# Patient Record
Sex: Female | Born: 1950 | Race: White | Hispanic: No | Marital: Married | Smoking: Never smoker
Health system: Southern US, Community
[De-identification: ages and names within clinical notes are randomized; demographics above are authoritative.]

## PROBLEM LIST (undated history)

## (undated) DIAGNOSIS — Z87442 Personal history of urinary calculi: Secondary | ICD-10-CM

## (undated) DIAGNOSIS — J45909 Unspecified asthma, uncomplicated: Secondary | ICD-10-CM

## (undated) DIAGNOSIS — Z85828 Personal history of other malignant neoplasm of skin: Secondary | ICD-10-CM

## (undated) DIAGNOSIS — Z9289 Personal history of other medical treatment: Secondary | ICD-10-CM

## (undated) DIAGNOSIS — M1990 Unspecified osteoarthritis, unspecified site: Secondary | ICD-10-CM

## (undated) DIAGNOSIS — C801 Malignant (primary) neoplasm, unspecified: Secondary | ICD-10-CM

## (undated) DIAGNOSIS — M810 Age-related osteoporosis without current pathological fracture: Secondary | ICD-10-CM

## (undated) DIAGNOSIS — Z8489 Family history of other specified conditions: Secondary | ICD-10-CM

## (undated) HISTORY — PX: CARPAL TUNNEL RELEASE: SHX101

## (undated) HISTORY — PX: COLONOSCOPY W/ POLYPECTOMY: SHX1380

## (undated) HISTORY — PX: TONSILLECTOMY: SUR1361

---

## 2007-09-18 ENCOUNTER — Other Ambulatory Visit: Admission: RE | Admit: 2007-09-18 | Discharge: 2007-09-18 | Payer: Self-pay | Admitting: Family Medicine

## 2009-03-18 ENCOUNTER — Other Ambulatory Visit: Admission: RE | Admit: 2009-03-18 | Discharge: 2009-03-18 | Payer: Self-pay | Admitting: Family Medicine

## 2010-08-25 HISTORY — PX: JOINT REPLACEMENT: SHX530

## 2010-09-21 ENCOUNTER — Inpatient Hospital Stay (HOSPITAL_COMMUNITY): Admission: RE | Admit: 2010-09-21 | Discharge: 2010-09-25 | Payer: Self-pay | Admitting: Orthopedic Surgery

## 2011-01-05 LAB — BASIC METABOLIC PANEL
BUN: 11 mg/dL (ref 6–23)
CO2: 27 mEq/L (ref 19–32)
Chloride: 105 mEq/L (ref 96–112)
Chloride: 106 mEq/L (ref 96–112)
Creatinine, Ser: 0.83 mg/dL (ref 0.4–1.2)
GFR calc Af Amer: >60 mL/min (ref >60)
GFR calc Af Amer: >60 mL/min (ref >60)
GFR calc non Af Amer: 55 mL/min — ABNORMAL LOW (ref >60)
GFR calc non Af Amer: 59 mL/min — ABNORMAL LOW (ref >60)
Potassium: 4.1 mEq/L (ref 3.5–5.1)
Potassium: 4.5 mEq/L (ref 3.5–5.1)
Sodium: 136 mEq/L (ref 135–145)

## 2011-01-05 LAB — TYPE AND SCREEN

## 2011-01-05 LAB — CBC
HCT: 28 % — ABNORMAL LOW (ref 36.0–46.0)
HCT: 28.7 % — ABNORMAL LOW (ref 36.0–46.0)
Hemoglobin: 10 g/dL — ABNORMAL LOW (ref 12.0–15.0)
Hemoglobin: 14.3 g/dL (ref 12.0–15.0)
MCH: 30.1 pg (ref 26.0–34.0)
MCH: 30.7 pg (ref 26.0–34.0)
MCH: 31.9 pg (ref 26.0–34.0)
MCV: 87.9 fL (ref 78.0–100.0)
MCV: 90.4 fL (ref 78.0–100.0)
MCV: 89.7 fL (ref 78.0–100.0)
MCV: 92.7 fL (ref 78.0–100.0)
Platelets: 131 10*3/uL — ABNORMAL LOW (ref 150–400)
Platelets: 153 10*3/uL (ref 150–400)
Platelets: 123 10*3/uL — ABNORMAL LOW (ref 150–400)
RBC: 3.1 MIL/uL — ABNORMAL LOW (ref 3.87–5.11)
RBC: 3.12 MIL/uL — ABNORMAL LOW (ref 3.87–5.11)
RBC: 4.47 MIL/uL (ref 3.87–5.11)
RDW: 12.9 % (ref 11.5–15.5)
RDW: 14.8 % (ref 11.5–15.5)
WBC: 6.1 10*3/uL (ref 4.0–10.5)
WBC: 6.1 10*3/uL (ref 4.0–10.5)
WBC: 6.4 10*3/uL (ref 4.0–10.5)

## 2011-01-05 LAB — URINALYSIS, ROUTINE W REFLEX MICROSCOPIC
Bilirubin Urine: NEGATIVE
Glucose, UA: NEGATIVE mg/dL
Hgb urine dipstick: NEGATIVE
Specific Gravity, Urine: 1.031 — ABNORMAL HIGH (ref 1.005–1.030)
pH: 6.5 (ref 5.0–8.0)

## 2011-01-05 LAB — PREPARE RBC (CROSSMATCH)

## 2011-01-05 LAB — PROTIME-INR
INR: 1.11 (ref 0.00–1.49)
INR: 1.56 — ABNORMAL HIGH (ref 0.00–1.49)
Prothrombin Time: 23.7 seconds — ABNORMAL HIGH (ref 11.6–15.2)

## 2011-01-05 LAB — COMPREHENSIVE METABOLIC PANEL
AST: 23 U/L (ref 0–37)
BUN: 17 mg/dL (ref 6–23)
CO2: 28 mEq/L (ref 19–32)
Chloride: 102 mEq/L (ref 96–112)
Creatinine, Ser: 1.1 mg/dL (ref 0.4–1.2)
GFR calc Af Amer: >60 mL/min (ref >60)
GFR calc non Af Amer: 51 mL/min — ABNORMAL LOW (ref >60)
Total Bilirubin: 0.8 mg/dL (ref 0.3–1.2)

## 2011-01-05 LAB — APTT: aPTT: 31 seconds (ref 24–37)

## 2011-01-05 LAB — ABO/RH: ABO/RH(D): O POS

## 2011-10-26 HISTORY — PX: FRACTURE SURGERY: SHX138

## 2014-02-04 ENCOUNTER — Other Ambulatory Visit (HOSPITAL_COMMUNITY)
Admission: RE | Admit: 2014-02-04 | Discharge: 2014-02-04 | Disposition: A | Discharge status: Home / Self Care | Payer: BC Managed Care – PPO | Source: Ambulatory Visit | Attending: Family Medicine | Admitting: Family Medicine

## 2014-02-04 ENCOUNTER — Other Ambulatory Visit: Payer: Self-pay | Admitting: Family Medicine

## 2014-02-04 DIAGNOSIS — Z1151 Encounter for screening for human papillomavirus (HPV): Secondary | ICD-10-CM | POA: Insufficient documentation

## 2014-02-04 DIAGNOSIS — Z01419 Encounter for gynecological examination (general) (routine) without abnormal findings: Secondary | ICD-10-CM | POA: Insufficient documentation

## 2015-06-05 ENCOUNTER — Ambulatory Visit: Payer: Self-pay | Admitting: Orthopedic Surgery

## 2015-06-05 NOTE — H&P (Signed)
Joan Chan DOB: [birth year] Married / Language: English / Race: White Female Date of Admission:  06/25/2015 CC:  Left Hip Pain History of Present Illness The patient is a 64 year old female who comes in for a preoperative History and Physical. The patient is scheduled for a left total hip arthroplasty (anterior) to be performed by Dr. Dione Plover. Aluisio, MD at [facility] on 06/25/2015. The patient is a 64 year old female who presented with a hip problem. The patient reports left hip problems including pain, weakness and stiffness symptoms that have been present for 2 year(s). The symptoms began without any known injury. The patient reports symptoms radiating to the: left groin and left thigh anteriorly.The patient feels as if their symptoms are does feel they are worsening. Current treatment includes nonsteroidal anti-inflammatory drugs (Cumarin). Pertinent medical history includes total hip replacement (on the right in 2011. She reports the right hip is doing well). Prior to being seen today the patient was previously evaluated by a colleague. Previous treatment for this problem has included corticosteroid injection (She reports her pain management physician ordered an injection. The injection really only for one day). She is doing "awesome" with regards to the right total hip replacemnent and is now ready to proceed with the left hip at this time. She has known arthritis with the left hip and has been putting it off for about a year due to timing with her riding. She has now reached a point where she would like to proceed with surgery and comes in to discuss the timing of the surgery and asking about the anterior approach. The hip has been progressive over the past year with increasing stiffness and decreasing range of motion and now night pain. She states that is had become more difficult to get shoes and socks on, getting in and out of the car, and going up and down steps. She continues to  stay active with her riding and also gets on the eliptical three times a week (which helps with her stiffness) for exercise. She still tries to play teenis but this hurts a good bit after coming off the court. She used Aleve and Advil in the past but nowmainly uses Cumarin which does make a difference. On the really bad days, she will even supplement this with an occasional Advil at time. She comes in the office to discuss total hip repalcement for the left hip. She said the right hip which we replaced several years ago is doing great. She is real pleased with that. Left hip starting to get progressively worse. She is having the above mentioned functional issues and having a lot of pain. She is at a stage now where she wants to get the left hip fixed. They have been treated conservatively in the past for the above stated problem and despite conservative measures, they continue to have progressive pain and severe functional limitations and dysfunction. They have failed non-operative management including home exercise, medications. It is felt that they would benefit from undergoing total joint replacement. Risks and benefits of the procedure have been discussed with the patient and they elect to proceed with surgery. There are no active contraindications to surgery such as ongoing infection or rapidly progressive neurological disease.  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  Intolerance Coumadin *ANTICOAGULANTS* Supertherapuetic with last total hip procedure - 2011  Family History Osteoarthritis mother Depression mother Cerebrovascular Accident father Heart disease in female family member before  age 26 Heart Disease father Cancer father Hypertension father  Social History Exercise Exercises daily; does running / walking, other and gym / weights Current work status working full time Drug/Alcohol Rehab (Currently) no Tobacco use never smoker Marital status married Alcohol use current drinker; drinks wine; 5-7 per week Illicit drug use no Number of flights of stairs before winded 2-3 Drug/Alcohol Rehab (Previously) no Living situation live with spouse Pain Contract no Children 0 Post-Surgical Plans Home Advance Directives Living Will, Healthcare POA  Medication History Calcium (Oral) Specific dose unknown - Active. Vitamin D3 (Oral) Specific dose unknown - Active. Fish Oil Active. Magnesium Gluconate (Oral) Specific dose unknown - Active. cumarin Active. Mulitvitamin Active. MSM Active. Vitamin C Active.  Past Surgical History Carpal Tunnel Repair right Total Hip Replacement Date: 08/2010. right Tonsillectomy Colon Polyp Removal - Colonoscopy Left Humerus Surgery for Open Fracture Date: 07/2012.  Review of Systems General Not Present- Chills, Fatigue, Fever, Memory Loss, Night Sweats, Weight Gain and Weight Loss. Skin Not Present- Eczema, Hives, Itching, Lesions and Rash. HEENT Not Present- Dentures, Double Vision, Headache, Hearing Loss, Tinnitus and Visual Loss. Respiratory Not Present- Allergies, Chronic Cough, Coughing up blood, Shortness of breath at rest and Shortness of breath with exertion. Cardiovascular Not Present- Chest Pain, Difficulty Breathing Lying Down, Murmur, Palpitations, Racing/skipping heartbeats and Swelling. Gastrointestinal Not Present- Abdominal Pain, Bloody Stool, Constipation, Diarrhea, Difficulty Swallowing, Heartburn, Jaundice, Loss of appetitie, Nausea and Vomiting. Female Genitourinary Not Present- Blood in Urine, Discharge, Flank Pain, Incontinence, Painful Urination, Urgency, Urinary frequency,  Urinary Retention, Urinating at Night and Weak urinary stream. Musculoskeletal Present- Joint Pain (left hip). Not Present- Back Pain, Joint Swelling, Morning Stiffness, Muscle Pain, Muscle Weakness and Spasms. Neurological Not Present- Blackout spells, Difficulty with balance, Dizziness, Paralysis, Tremor and Weakness. Psychiatric Not Present- Insomnia.  Vitals Weight: 163 lb Height: 71in Weight was reported by patient. Height was reported by patient. Body Surface Area: 1.93 m Body Mass Index: 22.73 kg/m  BP: 128/70 (Sitting, Right Arm, Standard)  Physical Exam General Mental Status -Alert, cooperative and good historian. General Appearance-pleasant, Not in acute distress. Orientation-Oriented X3. Build & Nutrition-Well nourished and Well developed.  Head and Neck Head-normocephalic, atraumatic . Neck Global Assessment - supple, no bruit auscultated on the right, no bruit auscultated on the left.  Eye Pupil - Bilateral-Regular and Round. Motion - Bilateral-EOMI.  Chest and Lung Exam Auscultation Breath sounds - clear at anterior chest wall and clear at posterior chest wall. Adventitious sounds - No Adventitious sounds.  Cardiovascular Auscultation Rhythm - Regular rate and rhythm. Heart Sounds - S1 WNL and S2 WNL. Murmurs & Other Heart Sounds - Auscultation of the heart reveals - No Murmurs.  Abdomen Palpation/Percussion Tenderness - Abdomen is non-tender to palpation. Rigidity (guarding) - Abdomen is soft. Auscultation Auscultation of the abdomen reveals - Bowel sounds normal.  Female Genitourinary Note: Not done, not pertinent to present illness  Musculoskeletal Note: On exam, very pleasant well-developed female, alert and oriented, no apparent distress. Right hip flexes 130, rotate in 30 and out 40, abduct 40 without discomfort. Left hip flexes about 100, rotate in 10 and out 20, abduct 20 with discomfort. Knee exams are normal. Pulse  sensation and motor intact. Antalgic gait pattern on the left.  RADIOGRAPHS AP pelvis and AP and lateral of both hips show  that the prosthesis on the right remains in excellent position, no abnormalities. On the left, she has got dysplasia with about 50% uncovering of her femoral head with bone-on-bone changes in the hip and subchondral cystic changes.  Assessment & Plan  Primary osteoarthritis of left hip (M16.12) Note:Surgical Plans: Left Total Hip Replacement - Anterior Approach  Disposition: Home  PCP: Dr. Cipriano Mile - Patient has been seen preoperatively and felt to be stable for surgery.  IV TXA  Anesthesia Issues: None  Signed electronically by Ok Edwards, III PA-C

## 2015-06-17 ENCOUNTER — Ambulatory Visit: Payer: Self-pay | Admitting: Orthopedic Surgery

## 2015-06-17 NOTE — Progress Notes (Signed)
Preoperative surgical orders have been place into the Epic hospital system for Joan Chan on 06/17/2015, 12:18 PM  by Mickel Crow for surgery on 06/25/2015.  Preop Total Hip orders including Experel Injecion, IV Tylenol, and IV Decadron as long as there are no contraindications to the above medications. Joan Muslim, PA-C

## 2015-06-19 NOTE — Patient Instructions (Addendum)
YOUR PROCEDURE IS SCHEDULED ON : 8/31/216  REPORT TO [facility] MAIN ENTRANCE FOLLOW SIGNS TO EAST ELEVATOR - GO TO 3rd FLOOR CHECK IN AT 3 EAST NURSES STATION (SHORT STAY) AT: 1:00 PM  CALL THIS NUMBER IF YOU HAVE PROBLEMS THE MORNING OF SURGERY [phone]  REMEMBER:ONLY 1 PER PERSON MAY GO TO SHORT STAY WITH YOU TO GET READY THE MORNING OF YOUR SURGERY  DO NOT EAT FOOD  AFTER MIDNIGHT  MAY HAVE CLEAR LIQUIDS UNTIL 9:00 AM  TAKE THESE MEDICINES THE MORNING OF SURGERY: NONE     CLEAR LIQUID DIET   Foods Allowed                                                                     Foods Excluded  Coffee and tea, regular and decaf                             liquids that you cannot  Plain Jell-O in any flavor                                             see through such as: Fruit ices (not with fruit pulp)                                     milk, soups, orange juice  Iced Popsicles                                                 All solid food Carbonated beverages, regular and diet                                    Cranberry, grape and apple juices Sports drinks like Gatorade Lightly seasoned clear broth or consume(fat free) Sugar, honey syrup  _____________________________________________________________________    YOU MAY NOT HAVE ANY METAL ON YOUR BODY INCLUDING HAIR PINS AND PIERCING'S. DO NOT WEAR JEWELRY, MAKEUP, LOTIONS, POWDERS OR PERFUMES. DO NOT WEAR NAIL POLISH. DO NOT SHAVE 48 HRS PRIOR TO SURGERY. MEN MAY SHAVE FACE AND NECK.  DO NOT [place]. [facility] IS NOT RESPONSIBLE FOR VALUABLES.  CONTACTS, DENTURES OR PARTIALS MAY NOT BE WORN TO SURGERY. LEAVE SUITCASE IN CAR. CAN BE BROUGHT TO ROOM AFTER SURGERY.  PATIENTS DISCHARGED THE DAY OF SURGERY WILL NOT BE ALLOWED TO DRIVE HOME.  PLEASE READ OVER THE FOLLOWING INSTRUCTION SHEETS _________________________________________________________________________________                                         - PREPARING FOR SURGERY  Before surgery, you can play an important role.  Because skin is not sterile, your skin needs  to be as free of germs as possible.  You can reduce the number of germs on your skin by washing with CHG (chlorahexidine gluconate) soap before surgery.  CHG is an antiseptic cleaner which kills germs and bonds with the skin to continue killing germs even after washing. Please DO NOT use if you have an allergy to CHG or antibacterial soaps.  If your skin becomes reddened/irritated stop using the CHG and inform your nurse when you arrive at Short Stay. Do not shave (including legs and underarms) for at least 48 hours prior to the first CHG shower.  You may shave your face. Please follow these instructions carefully:   1.  Shower with CHG Soap the night before surgery and the  morning of Surgery.   2.  If you choose to wash your hair, wash your hair first as usual with your  normal  Shampoo.   3.  After you shampoo, rinse your hair and body thoroughly to remove the  shampoo.                                         4.  Use CHG as you would any other liquid soap.  You can apply chg directly  to the skin and wash . Gently wash with scrungie or clean wascloth    5.  Apply the CHG Soap to your body ONLY FROM THE NECK DOWN.   Do not use on open                           Wound or open sores. Avoid contact with eyes, ears mouth and genitals (private parts).                        Genitals (private parts) with your normal soap.              6.  Wash thoroughly, paying special attention to the area where your surgery  will be performed.   7.  Thoroughly rinse your body with warm water from the neck down.   8.  DO NOT shower/wash with your normal soap after using and rinsing off  the CHG Soap .                9.  Pat yourself dry with a clean towel.             10.  Wear clean night clothes to bed after shower             11.  Place  clean sheets on your bed the night of your first shower and do not  sleep with pets.  Day of Surgery : Do not apply any lotions/deodorants the morning of surgery.  Please wear clean clothes to the hospital/surgery center.  FAILURE TO FOLLOW THESE INSTRUCTIONS MAY RESULT IN THE CANCELLATION OF YOUR SURGERY    PATIENT SIGNATURE_________________________________  ______________________________________________________________________     Joan Chan  An incentive spirometer is a tool that can help keep your lungs clear and active. This tool measures how well you are filling your lungs with each breath. Taking long deep breaths may help reverse or decrease the chance of developing breathing (pulmonary) problems (especially infection) following:  A long period of time when you are unable to move or be active. BEFORE THE PROCEDURE  If the spirometer includes an indicator to show your best effort, your nurse or respiratory therapist will set it to a desired goal.  If possible, sit up straight or lean slightly forward. Try not to slouch.  Hold the incentive spirometer in an upright position. INSTRUCTIONS FOR USE   Sit on the edge of your bed if possible, or sit up as far as you can in bed or on a chair.  Hold the incentive spirometer in an upright position.  Breathe out normally.  Place the mouthpiece in your mouth and seal your lips tightly around it.  Breathe in slowly and as deeply as possible, raising the piston or the ball toward the top of the column.  Hold your breath for 3-5 seconds or for as long as possible. Allow the piston or ball to fall to the bottom of the column.  Remove the mouthpiece from your mouth and breathe out normally.  Rest for a few seconds and repeat Steps 1 through 7 at least 10 times every 1-2 hours when you are awake. Take your time and take a few normal breaths between deep breaths.  The spirometer may include an indicator to show your best  effort. Use the indicator as a goal to work toward during each repetition.  After each set of 10 deep breaths, practice coughing to be sure your lungs are clear. If you have an incision (the cut made at the time of surgery), support your incision when coughing by placing a pillow or rolled up towels firmly against it. Once you are able to get out of bed, walk around indoors and cough well. You may stop using the incentive spirometer when instructed by your caregiver.  RISKS AND COMPLICATIONS  Take your time so you do not get dizzy or light-headed.  If you are in pain, you may need to take or ask for pain medication before doing incentive spirometry. It is harder to take a deep breath if you are having pain. AFTER USE  Rest and breathe slowly and easily.  It can be helpful to keep track of a log of your progress. Your caregiver can provide you with a simple table to help with this. If you are using the spirometer at home, follow these instructions: [place] IF:   You are having difficultly using the spirometer.  You have trouble using the spirometer as often as instructed.  Your pain medication is not giving enough relief while using the spirometer.  You develop fever of 100.5 F (38.1 C) or higher. SEEK IMMEDIATE MEDICAL CARE IF:   You cough up bloody sputum that had not been present before.  You develop fever of 102 F (38.9 C) or greater.  You develop worsening pain at or near the incision site. MAKE SURE YOU:   Understand these instructions.  Will watch your condition.  Will get help right away if you are not doing well or get worse. Document Released: 02/21/2007 Document Revised: 01/03/2012 Document Reviewed: 04/24/2007 Exit[employer] Patient Information 2014 [employer], [place].   ________________________________________________________________________  WHAT IS A BLOOD TRANSFUSION? Blood Transfusion Information  A transfusion is the replacement of blood or some of  its parts. Blood is made up of multiple cells which provide different functions.  Red blood cells carry oxygen and are used for blood loss replacement.  White blood cells fight against infection.  Platelets control bleeding.  Plasma helps clot blood.  Other blood products are available for specialized needs, such as hemophilia or other clotting  disorders. BEFORE THE TRANSFUSION  Who gives blood for transfusions?   Healthy volunteers who are fully evaluated to make sure their blood is safe. This is blood bank blood. Transfusion therapy is the safest it has ever been in the practice of medicine. Before blood is taken from a donor, a complete history is taken to make sure that person has no history of diseases nor engages in risky social behavior (examples are intravenous drug use or sexual activity with multiple partners). The donor's travel history is screened to minimize risk of transmitting infections, such as malaria. The donated blood is tested for signs of infectious diseases, such as HIV and hepatitis. The blood is then tested to be sure it is compatible with you in order to minimize the chance of a transfusion reaction. If you or a relative donates blood, this is often done in anticipation of surgery and is not appropriate for emergency situations. It takes many days to process the donated blood. RISKS AND COMPLICATIONS Although transfusion therapy is very safe and saves many lives, the main dangers of transfusion include:   Getting an infectious disease.  Developing a transfusion reaction. This is an allergic reaction to something in the blood you were given. Every precaution is taken to prevent this. The decision to have a blood transfusion has been considered carefully by your caregiver before blood is given. Blood is not given unless the benefits outweigh the risks. AFTER THE TRANSFUSION  Right after receiving a blood transfusion, you will usually feel much better and more  energetic. This is especially true if your red blood cells have gotten low (anemic). The transfusion raises the level of the red blood cells which carry oxygen, and this usually causes an energy increase.  The nurse administering the transfusion will monitor you carefully for complications. HOME CARE INSTRUCTIONS  No special instructions are needed after a transfusion. You may find your energy is better. Speak with your caregiver about any limitations on activity for underlying diseases you may have. SEEK MEDICAL CARE IF:   Your condition is not improving after your transfusion.  You develop redness or irritation at the intravenous (IV) site. SEEK IMMEDIATE MEDICAL CARE IF:  Any of the following symptoms occur over the next 12 hours:  Shaking chills.  You have a temperature by mouth above 102 F (38.9 C), not controlled by medicine.  Chest, back, or muscle pain.  People around you feel you are not acting correctly or are confused.  Shortness of breath or difficulty breathing.  Dizziness and fainting.  You get a rash or develop hives.  You have a decrease in urine output.  Your urine turns a dark color or changes to pink, red, or brown. Any of the following symptoms occur over the next 10 days:  You have a temperature by mouth above 102 F (38.9 C), not controlled by medicine.  Shortness of breath.  Weakness after normal activity.  The white part of the eye turns yellow (jaundice).  You have a decrease in the amount of urine or are urinating less often.  Your urine turns a dark color or changes to pink, red, or brown. Document Released: 10/08/2000 Document Revised: 01/03/2012 Document Reviewed: 05/27/2008 [facility] Patient Information 2014 [place], [place].  _______________________________________________________________________

## 2015-06-20 ENCOUNTER — Encounter (HOSPITAL_COMMUNITY): Payer: Self-pay

## 2015-06-20 ENCOUNTER — Encounter (HOSPITAL_COMMUNITY)
Admission: RE | Admit: 2015-06-20 | Discharge: 2015-06-20 | Disposition: A | Discharge status: Home / Self Care | Payer: Medicare Other | Source: Ambulatory Visit | Attending: Orthopedic Surgery | Admitting: Orthopedic Surgery

## 2015-06-20 DIAGNOSIS — M169 Osteoarthritis of hip, unspecified: Secondary | ICD-10-CM | POA: Insufficient documentation

## 2015-06-20 DIAGNOSIS — Z85828 Personal history of other malignant neoplasm of skin: Secondary | ICD-10-CM | POA: Insufficient documentation

## 2015-06-20 DIAGNOSIS — Z01812 Encounter for preprocedural laboratory examination: Secondary | ICD-10-CM | POA: Insufficient documentation

## 2015-06-20 DIAGNOSIS — Z87442 Personal history of urinary calculi: Secondary | ICD-10-CM | POA: Insufficient documentation

## 2015-06-20 HISTORY — DX: Personal history of other malignant neoplasm of skin: Z85.828

## 2015-06-20 HISTORY — DX: Unspecified osteoarthritis, unspecified site: M19.90

## 2015-06-20 HISTORY — DX: Personal history of urinary calculi: Z87.442

## 2015-06-20 HISTORY — DX: Unspecified asthma, uncomplicated: J45.909

## 2015-06-20 HISTORY — DX: Personal history of other medical treatment: Z92.89

## 2015-06-20 HISTORY — DX: Malignant (primary) neoplasm, unspecified: C80.1

## 2015-06-20 HISTORY — DX: Age-related osteoporosis without current pathological fracture: M81.0

## 2015-06-20 HISTORY — DX: Family history of other specified conditions: Z84.89

## 2015-06-20 LAB — CBC
HEMATOCRIT: 39.8 % (ref 36.0–46.0)
HEMOGLOBIN: 13.3 g/dL (ref 12.0–15.0)
MCH: 30.4 pg (ref 26.0–34.0)
MCHC: 33.4 g/dL (ref 30.0–36.0)
MCV: 91.1 fL (ref 78.0–100.0)
Platelets: 202 10*3/uL (ref 150–400)
RBC: 4.37 MIL/uL (ref 3.87–5.11)
RDW: 13.2 % (ref 11.5–15.5)
WBC: 5.2 10*3/uL (ref 4.0–10.5)

## 2015-06-20 LAB — URINALYSIS, ROUTINE W REFLEX MICROSCOPIC
Bilirubin Urine: NEGATIVE
Glucose, UA: NEGATIVE mg/dL
Hgb urine dipstick: NEGATIVE
Ketones, ur: NEGATIVE mg/dL
LEUKOCYTES UA: NEGATIVE
NITRITE: NEGATIVE
PH: 5.5 (ref 5.0–8.0)
Protein, ur: NEGATIVE mg/dL
SPECIFIC GRAVITY, URINE: 1.02 (ref 1.005–1.030)
Urobilinogen, UA: 0.2 mg/dL (ref 0.0–1.0)

## 2015-06-20 LAB — COMPREHENSIVE METABOLIC PANEL
ALK PHOS: 90 U/L (ref 38–126)
ALT: 19 U/L (ref 14–54)
ANION GAP: 9 (ref 5–15)
AST: 29 U/L (ref 15–41)
Albumin: 4.2 g/dL (ref 3.5–5.0)
BILIRUBIN TOTAL: 0.6 mg/dL (ref 0.3–1.2)
BUN: 18 mg/dL (ref 6–20)
CALCIUM: 9.1 mg/dL (ref 8.9–10.3)
CO2: 24 mmol/L (ref 22–32)
Chloride: 106 mmol/L (ref 101–111)
Creatinine, Ser: 0.91 mg/dL (ref 0.44–1.00)
GFR calc non Af Amer: >60 mL/min (ref >60)
Glucose, Bld: 105 mg/dL — ABNORMAL HIGH (ref 65–99)
Potassium: 4.6 mmol/L (ref 3.5–5.1)
SODIUM: 139 mmol/L (ref 135–145)
TOTAL PROTEIN: 6.9 g/dL (ref 6.5–8.1)

## 2015-06-20 LAB — PROTIME-INR
INR: 1.02 (ref 0.00–1.49)
PROTHROMBIN TIME: 13.6 s (ref 11.6–15.2)

## 2015-06-20 LAB — APTT: aPTT: 30 seconds (ref 24–37)

## 2015-06-20 LAB — SURGICAL PCR SCREEN
MRSA, PCR: NEGATIVE
STAPHYLOCOCCUS AUREUS: NEGATIVE

## 2015-06-23 LAB — TYPE AND SCREEN
ABO/RH(D): O POS
Antibody Screen: POSITIVE
DAT, IgG: NEGATIVE
PT AG Type: NEGATIVE

## 2015-06-23 NOTE — Progress Notes (Signed)
Received call from lab - pt has antibodies and needs T/S recollected

## 2015-06-25 ENCOUNTER — Inpatient Hospital Stay (HOSPITAL_COMMUNITY): Payer: Medicare Other

## 2015-06-25 ENCOUNTER — Inpatient Hospital Stay (HOSPITAL_COMMUNITY)
Admission: RE | Admit: 2015-06-25 | Discharge: 2015-06-26 | DRG: 470 | Disposition: A | Discharge status: Home Health | Payer: Medicare Other | Source: Ambulatory Visit | Attending: Orthopedic Surgery | Admitting: Orthopedic Surgery

## 2015-06-25 ENCOUNTER — Inpatient Hospital Stay (HOSPITAL_COMMUNITY): Payer: Medicare Other | Admitting: Anesthesiology

## 2015-06-25 ENCOUNTER — Encounter (HOSPITAL_COMMUNITY): Payer: Self-pay | Admitting: *Deleted

## 2015-06-25 ENCOUNTER — Encounter (HOSPITAL_COMMUNITY)
Admission: RE | Disposition: A | Discharge status: Home Health | Payer: Self-pay | Source: Ambulatory Visit | Attending: Orthopedic Surgery

## 2015-06-25 DIAGNOSIS — M169 Osteoarthritis of hip, unspecified: Secondary | ICD-10-CM | POA: Diagnosis present

## 2015-06-25 DIAGNOSIS — Z01812 Encounter for preprocedural laboratory examination: Secondary | ICD-10-CM | POA: Diagnosis not present

## 2015-06-25 DIAGNOSIS — Z96649 Presence of unspecified artificial hip joint: Secondary | ICD-10-CM

## 2015-06-25 DIAGNOSIS — Z87442 Personal history of urinary calculi: Secondary | ICD-10-CM | POA: Diagnosis not present

## 2015-06-25 DIAGNOSIS — Z85828 Personal history of other malignant neoplasm of skin: Secondary | ICD-10-CM

## 2015-06-25 DIAGNOSIS — M810 Age-related osteoporosis without current pathological fracture: Secondary | ICD-10-CM | POA: Diagnosis present

## 2015-06-25 DIAGNOSIS — Z471 Aftercare following joint replacement surgery: Secondary | ICD-10-CM | POA: Diagnosis not present

## 2015-06-25 DIAGNOSIS — Z96642 Presence of left artificial hip joint: Secondary | ICD-10-CM | POA: Diagnosis not present

## 2015-06-25 DIAGNOSIS — M1612 Unilateral primary osteoarthritis, left hip: Secondary | ICD-10-CM | POA: Diagnosis not present

## 2015-06-25 DIAGNOSIS — Z96641 Presence of right artificial hip joint: Secondary | ICD-10-CM | POA: Diagnosis present

## 2015-06-25 DIAGNOSIS — M25552 Pain in left hip: Secondary | ICD-10-CM | POA: Diagnosis not present

## 2015-06-25 HISTORY — PX: TOTAL HIP ARTHROPLASTY: SHX124

## 2015-06-25 SURGERY — ARTHROPLASTY, HIP, TOTAL, ANTERIOR APPROACH
Anesthesia: General | Site: Hip | Laterality: Left

## 2015-06-25 MED ORDER — MENTHOL 3 MG MT LOZG: 1.0000 | LOZENGE | OROMUCOSAL | Status: DC | PRN

## 2015-06-25 MED ORDER — FENTANYL CITRATE (PF) 100 MCG/2ML IJ SOLN
INTRAMUSCULAR | Status: AC
  Filled 2015-06-25: qty 4

## 2015-06-25 MED ORDER — 0.9 % SODIUM CHLORIDE (POUR BTL) OPTIME
TOPICAL | Status: DC | PRN
  Administered 2015-06-25: 1000 mL

## 2015-06-25 MED ORDER — LIDOCAINE HCL (CARDIAC) 20 MG/ML IV SOLN
INTRAVENOUS | Status: DC | PRN
  Administered 2015-06-25: 50 mg via INTRAVENOUS

## 2015-06-25 MED ORDER — DEXAMETHASONE SODIUM PHOSPHATE 10 MG/ML IJ SOLN
10.0000 mg | Freq: Once | INTRAMUSCULAR | Status: AC
  Administered 2015-06-25: 10 mg via INTRAVENOUS

## 2015-06-25 MED ORDER — PROMETHAZINE HCL 25 MG/ML IJ SOLN
INTRAMUSCULAR | Status: AC
  Filled 2015-06-25: qty 1

## 2015-06-25 MED ORDER — HYDROMORPHONE HCL 1 MG/ML IJ SOLN
0.2500 mg | INTRAMUSCULAR | Status: DC | PRN
  Administered 2015-06-25 (×2): 0.5 mg via INTRAVENOUS

## 2015-06-25 MED ORDER — ACETAMINOPHEN 500 MG PO TABS
1000.0000 mg | ORAL_TABLET | Freq: Four times a day (QID) | ORAL | Status: DC
  Administered 2015-06-25 – 2015-06-26 (×3): 1000 mg via ORAL
  Filled 2015-06-25 (×4): qty 2

## 2015-06-25 MED ORDER — HYDROMORPHONE HCL 1 MG/ML IJ SOLN
INTRAMUSCULAR | Status: AC
  Filled 2015-06-25: qty 1

## 2015-06-25 MED ORDER — SODIUM CHLORIDE 0.9 % IV SOLN
INTRAVENOUS | Status: DC
  Administered 2015-06-26: 20 mL/h via INTRAVENOUS

## 2015-06-25 MED ORDER — VANCOMYCIN HCL IN DEXTROSE 1-5 GM/200ML-% IV SOLN
1000.0000 mg | INTRAVENOUS | Status: AC
Start: 2015-06-26 — End: 2015-06-25
  Administered 2015-06-25: 1000 mg via INTRAVENOUS
  Filled 2015-06-25: qty 200

## 2015-06-25 MED ORDER — POLYETHYLENE GLYCOL 3350 17 G PO PACK
17.0000 g | PACK | Freq: Every day | ORAL | Status: DC | PRN
Start: 2015-06-25 — End: 2015-06-26

## 2015-06-25 MED ORDER — KETOROLAC TROMETHAMINE 15 MG/ML IJ SOLN
INTRAMUSCULAR | Status: AC
  Filled 2015-06-25: qty 1

## 2015-06-25 MED ORDER — KETOROLAC TROMETHAMINE 15 MG/ML IJ SOLN
7.5000 mg | Freq: Four times a day (QID) | INTRAMUSCULAR | Status: AC | PRN
  Administered 2015-06-25: 7.5 mg via INTRAVENOUS

## 2015-06-25 MED ORDER — ACETAMINOPHEN 650 MG RE SUPP: 650.0000 mg | Freq: Four times a day (QID) | RECTAL | Status: DC | PRN

## 2015-06-25 MED ORDER — DEXAMETHASONE SODIUM PHOSPHATE 10 MG/ML IJ SOLN
10.0000 mg | Freq: Once | INTRAMUSCULAR | Status: AC
  Administered 2015-06-26: 10 mg via INTRAVENOUS
  Filled 2015-06-25: qty 1

## 2015-06-25 MED ORDER — GLYCOPYRROLATE 0.2 MG/ML IJ SOLN
INTRAMUSCULAR | Status: DC | PRN
  Administered 2015-06-25: 0.6 mg via INTRAVENOUS

## 2015-06-25 MED ORDER — ONDANSETRON HCL 4 MG/2ML IJ SOLN
INTRAMUSCULAR | Status: DC | PRN
  Administered 2015-06-25: 4 mg via INTRAVENOUS

## 2015-06-25 MED ORDER — METHOCARBAMOL 1000 MG/10ML IJ SOLN
500.0000 mg | Freq: Four times a day (QID) | INTRAVENOUS | Status: DC | PRN
  Administered 2015-06-25: 500 mg via INTRAVENOUS
  Filled 2015-06-25 (×2): qty 5

## 2015-06-25 MED ORDER — FLEET ENEMA 7-19 GM/118ML RE ENEM: 1.0000 | ENEMA | Freq: Once | RECTAL | Status: DC | PRN

## 2015-06-25 MED ORDER — MIDAZOLAM HCL 2 MG/2ML IJ SOLN
INTRAMUSCULAR | Status: AC
  Filled 2015-06-25: qty 4

## 2015-06-25 MED ORDER — METOCLOPRAMIDE HCL 10 MG PO TABS
5.0000 mg | ORAL_TABLET | Freq: Three times a day (TID) | ORAL | Status: DC | PRN
  Filled 2015-06-25: qty 1

## 2015-06-25 MED ORDER — SODIUM CHLORIDE 0.9 % IV SOLN
INTRAVENOUS | Status: DC
Start: 2015-06-25 — End: 2015-06-25

## 2015-06-25 MED ORDER — PHENOL 1.4 % MT LIQD
1.0000 | OROMUCOSAL | Status: DC | PRN
  Filled 2015-06-25: qty 177

## 2015-06-25 MED ORDER — CEFAZOLIN SODIUM-DEXTROSE 2-3 GM-% IV SOLR
2.0000 g | Freq: Four times a day (QID) | INTRAVENOUS | Status: AC
  Administered 2015-06-25 – 2015-06-26 (×2): 2 g via INTRAVENOUS
  Filled 2015-06-25 (×2): qty 50

## 2015-06-25 MED ORDER — LACTATED RINGERS IV SOLN
INTRAVENOUS | Status: DC
  Administered 2015-06-25: 14:00:00 via INTRAVENOUS

## 2015-06-25 MED ORDER — PROPOFOL 10 MG/ML IV BOLUS
INTRAVENOUS | Status: DC | PRN
  Administered 2015-06-25: 50 mg via INTRAVENOUS
  Administered 2015-06-25: 150 mg via INTRAVENOUS

## 2015-06-25 MED ORDER — CHLORHEXIDINE GLUCONATE 4 % EX LIQD: 60.0000 mL | Freq: Once | CUTANEOUS | Status: DC

## 2015-06-25 MED ORDER — BUPIVACAINE HCL (PF) 0.25 % IJ SOLN
INTRAMUSCULAR | Status: AC
  Filled 2015-06-25: qty 30

## 2015-06-25 MED ORDER — FENTANYL CITRATE (PF) 100 MCG/2ML IJ SOLN
INTRAMUSCULAR | Status: DC | PRN
  Administered 2015-06-25 (×2): 50 ug via INTRAVENOUS
  Administered 2015-06-25 (×2): 100 ug via INTRAVENOUS
  Administered 2015-06-25: 50 ug via INTRAVENOUS

## 2015-06-25 MED ORDER — DIPHENHYDRAMINE HCL 12.5 MG/5ML PO ELIX: 12.5000 mg | ORAL_SOLUTION | ORAL | Status: DC | PRN

## 2015-06-25 MED ORDER — STERILE WATER FOR IRRIGATION IR SOLN
Status: DC | PRN
  Administered 2015-06-25: 1000 mL

## 2015-06-25 MED ORDER — PROMETHAZINE HCL 25 MG/ML IJ SOLN
6.2500 mg | Freq: Once | INTRAMUSCULAR | Status: AC
  Administered 2015-06-25: 6.25 mg via INTRAVENOUS

## 2015-06-25 MED ORDER — DOCUSATE SODIUM 100 MG PO CAPS
100.0000 mg | ORAL_CAPSULE | Freq: Two times a day (BID) | ORAL | Status: DC
  Administered 2015-06-25 – 2015-06-26 (×2): 100 mg via ORAL

## 2015-06-25 MED ORDER — METOCLOPRAMIDE HCL 5 MG/ML IJ SOLN
INTRAMUSCULAR | Status: AC
  Filled 2015-06-25: qty 2

## 2015-06-25 MED ORDER — BUPIVACAINE HCL (PF) 0.25 % IJ SOLN
INTRAMUSCULAR | Status: DC | PRN
  Administered 2015-06-25: 30 mL

## 2015-06-25 MED ORDER — TRAMADOL HCL 50 MG PO TABS: 50.0000 mg | ORAL_TABLET | Freq: Four times a day (QID) | ORAL | Status: DC | PRN

## 2015-06-25 MED ORDER — HYDROMORPHONE HCL 1 MG/ML IJ SOLN
INTRAMUSCULAR | Status: AC
Start: 2015-06-25 — End: 2015-06-26
  Filled 2015-06-25: qty 1

## 2015-06-25 MED ORDER — ONDANSETRON HCL 4 MG/2ML IJ SOLN: 4.0000 mg | Freq: Four times a day (QID) | INTRAMUSCULAR | Status: DC | PRN

## 2015-06-25 MED ORDER — ROCURONIUM BROMIDE 100 MG/10ML IV SOLN
INTRAVENOUS | Status: DC | PRN
  Administered 2015-06-25: 40 mg via INTRAVENOUS

## 2015-06-25 MED ORDER — ACETAMINOPHEN 10 MG/ML IV SOLN
1000.0000 mg | Freq: Once | INTRAVENOUS | Status: AC
  Administered 2015-06-25: 1000 mg via INTRAVENOUS
  Filled 2015-06-25: qty 100

## 2015-06-25 MED ORDER — PROPOFOL 10 MG/ML IV BOLUS
INTRAVENOUS | Status: AC
  Filled 2015-06-25: qty 20

## 2015-06-25 MED ORDER — ONDANSETRON HCL 4 MG PO TABS: 4.0000 mg | ORAL_TABLET | Freq: Four times a day (QID) | ORAL | Status: DC | PRN

## 2015-06-25 MED ORDER — NEOSTIGMINE METHYLSULFATE 10 MG/10ML IV SOLN
INTRAVENOUS | Status: DC | PRN
  Administered 2015-06-25: 4 mg via INTRAVENOUS

## 2015-06-25 MED ORDER — TRANEXAMIC ACID 1000 MG/10ML IV SOLN
1000.0000 mg | INTRAVENOUS | Status: AC
  Administered 2015-06-25: 1000 mg via INTRAVENOUS
  Filled 2015-06-25: qty 10

## 2015-06-25 MED ORDER — RIVAROXABAN 10 MG PO TABS
10.0000 mg | ORAL_TABLET | Freq: Every day | ORAL | Status: DC
  Administered 2015-06-26: 10 mg via ORAL
  Filled 2015-06-25 (×2): qty 1

## 2015-06-25 MED ORDER — METOCLOPRAMIDE HCL 5 MG/ML IJ SOLN
5.0000 mg | Freq: Three times a day (TID) | INTRAMUSCULAR | Status: DC | PRN
  Administered 2015-06-25: 10 mg via INTRAVENOUS

## 2015-06-25 MED ORDER — ACETAMINOPHEN 10 MG/ML IV SOLN
INTRAVENOUS | Status: AC
  Filled 2015-06-25: qty 100

## 2015-06-25 MED ORDER — MORPHINE SULFATE (PF) 2 MG/ML IV SOLN: 1.0000 mg | INTRAVENOUS | Status: DC | PRN

## 2015-06-25 MED ORDER — BISACODYL 10 MG RE SUPP: 10.0000 mg | Freq: Every day | RECTAL | Status: DC | PRN

## 2015-06-25 MED ORDER — HYDROMORPHONE HCL 1 MG/ML IJ SOLN
0.2500 mg | INTRAMUSCULAR | Status: DC | PRN
  Administered 2015-06-25 (×4): 0.5 mg via INTRAVENOUS

## 2015-06-25 MED ORDER — MIDAZOLAM HCL 5 MG/5ML IJ SOLN
INTRAMUSCULAR | Status: DC | PRN
  Administered 2015-06-25: 2 mg via INTRAVENOUS

## 2015-06-25 MED ORDER — ACETAMINOPHEN 325 MG PO TABS: 650.0000 mg | ORAL_TABLET | Freq: Four times a day (QID) | ORAL | Status: DC | PRN

## 2015-06-25 MED ORDER — FENTANYL CITRATE (PF) 250 MCG/5ML IJ SOLN
INTRAMUSCULAR | Status: AC
  Filled 2015-06-25: qty 25

## 2015-06-25 MED ORDER — METHOCARBAMOL 500 MG PO TABS
500.0000 mg | ORAL_TABLET | Freq: Four times a day (QID) | ORAL | Status: DC | PRN
  Administered 2015-06-26: 500 mg via ORAL
  Filled 2015-06-25: qty 1

## 2015-06-25 MED ORDER — OXYCODONE HCL 5 MG PO TABS
5.0000 mg | ORAL_TABLET | ORAL | Status: DC | PRN
  Administered 2015-06-25 – 2015-06-26 (×3): 5 mg via ORAL
  Filled 2015-06-25 (×3): qty 1

## 2015-06-25 SURGICAL SUPPLY — 44 items
BAG DECANTER FOR FLEXI CONT (MISCELLANEOUS) IMPLANT
BAG ZIPLOCK 12X15 (MISCELLANEOUS) IMPLANT
BLADE EXTENDED COATED 6.5IN (ELECTRODE) ×3 IMPLANT
BLADE SAG 18X100X1.27 (BLADE) ×3 IMPLANT
CAPT HIP TOTAL 2 ×3 IMPLANT
CLOSURE WOUND 1/2 X4 (GAUZE/BANDAGES/DRESSINGS) ×1
COVER PERINEAL POST (MISCELLANEOUS) ×3 IMPLANT
DECANTER SPIKE VIAL GLASS SM (MISCELLANEOUS) IMPLANT
DRAPE C-ARM 42X120 X-RAY (DRAPES) ×3 IMPLANT
DRAPE STERI IOBAN 125X83 (DRAPES) ×3 IMPLANT
DRAPE U-SHAPE 47X51 STRL (DRAPES) ×9 IMPLANT
DRSG ADAPTIC 3X8 NADH LF (GAUZE/BANDAGES/DRESSINGS) ×3 IMPLANT
DRSG MEPILEX BORDER 4X4 (GAUZE/BANDAGES/DRESSINGS) ×3 IMPLANT
DRSG MEPILEX BORDER 4X8 (GAUZE/BANDAGES/DRESSINGS) ×3 IMPLANT
DURAPREP 26ML APPLICATOR (WOUND CARE) ×3 IMPLANT
ELECT REM PT RETURN 9FT ADLT (ELECTROSURGICAL) ×3
ELECTRODE REM PT RTRN 9FT ADLT (ELECTROSURGICAL) ×1 IMPLANT
EVACUATOR 1/8 PVC DRAIN (DRAIN) ×3 IMPLANT
FACESHIELD WRAPAROUND (MASK) ×12 IMPLANT
GLOVE BIO SURGEON STRL SZ7.5 (GLOVE) ×3 IMPLANT
GLOVE BIO SURGEON STRL SZ8 (GLOVE) ×3 IMPLANT
GLOVE BIOGEL PI IND STRL 8 (GLOVE) ×2 IMPLANT
GLOVE BIOGEL PI INDICATOR 8 (GLOVE) ×4
GOWN STRL REUS W/TWL LRG LVL3 (GOWN DISPOSABLE) ×3 IMPLANT
GOWN STRL REUS W/TWL XL LVL3 (GOWN DISPOSABLE) ×3 IMPLANT
KIT BASIN OR (CUSTOM PROCEDURE TRAY) ×3 IMPLANT
NDL SAFETY ECLIPSE 18X1.5 (NEEDLE) ×1 IMPLANT
NEEDLE HYPO 18GX1.5 SHARP (NEEDLE) ×2
PACK TOTAL JOINT (CUSTOM PROCEDURE TRAY) ×3 IMPLANT
PEN SKIN MARKING BROAD (MISCELLANEOUS) ×3 IMPLANT
SEALER BIPOLAR AQUA 6.0 (INSTRUMENTS) ×3 IMPLANT
STRIP CLOSURE SKIN 1/2X4 (GAUZE/BANDAGES/DRESSINGS) ×2 IMPLANT
SUT ETHIBOND NAB CT1 #1 30IN (SUTURE) ×3 IMPLANT
SUT MNCRL AB 4-0 PS2 18 (SUTURE) ×3 IMPLANT
SUT VIC AB 2-0 CT1 27 (SUTURE) ×6
SUT VIC AB 2-0 CT1 TAPERPNT 27 (SUTURE) ×3 IMPLANT
SUT VLOC 180 0 24IN GS25 (SUTURE) IMPLANT
SYR 30ML LL (SYRINGE) ×3 IMPLANT
SYR 50ML LL SCALE MARK (SYRINGE) IMPLANT
TOWEL OR 17X26 10 PK STRL BLUE (TOWEL DISPOSABLE) ×3 IMPLANT
TOWEL OR NON WOVEN STRL DISP B (DISPOSABLE) ×3 IMPLANT
TRAY FOLEY W/METER SILVER 14FR (SET/KITS/TRAYS/PACK) ×3 IMPLANT
TRAY FOLEY W/METER SILVER 16FR (SET/KITS/TRAYS/PACK) IMPLANT
YANKAUER SUCT BULB TIP 10FT TU (MISCELLANEOUS) IMPLANT

## 2015-06-25 NOTE — Anesthesia Postprocedure Evaluation (Signed)
  Anesthesia Post-op Note  Patient: Joan Chan  Procedure(s) Performed: Procedure(s) (LRB): TOTAL LEFT HIP ARTHROPLASTY ANTERIOR APPROACH (Left)  Patient Location: PACU  Anesthesia Type: General  Level of Consciousness: awake and alert   Airway and Oxygen Therapy: Patient Spontanous Breathing  Post-op Pain: mild  Post-op Assessment: Post-op Vital signs reviewed, Patient's Cardiovascular Status Stable, Respiratory Function Stable, Patent Airway and No signs of Nausea or vomiting  Last Vitals:  Filed Vitals:   06/25/15 1800  BP: 130/70  Pulse: 55  Temp:   Resp: 15    Post-op Vital Signs: stable   Complications: No apparent anesthesia complications

## 2015-06-25 NOTE — H&P (View-Only) (Signed)
Joan Chan DOB: 04/16/1951 Married / Language: English / Race: White Female Date of Admission:  06/25/2015 CC:  Left Hip Pain History of Present Illness The patient is a 63 year old female who comes in for a preoperative History and Physical. The patient is scheduled for a left total hip arthroplasty (anterior) to be performed by Dr. Frank V. Aluisio, MD at [facility] on 06/25/2015. The patient is a 63 year old female who presented with a hip problem. The patient reports left hip problems including pain, weakness and stiffness symptoms that have been present for 2 year(s). The symptoms began without any known injury. The patient reports symptoms radiating to the: left groin and left thigh anteriorly.The patient feels as if their symptoms are does feel they are worsening. Current treatment includes nonsteroidal anti-inflammatory drugs (Cumarin). Pertinent medical history includes total hip replacement (on the right in 2011. She reports the right hip is doing well). Prior to being seen today the patient was previously evaluated by a colleague. Previous treatment for this problem has included corticosteroid injection (She reports her pain management physician ordered an injection. The injection really only for one day). She is doing "awesome" with regards to the right total hip replacemnent and is now ready to proceed with the left hip at this time. She has known arthritis with the left hip and has been putting it off for about a year due to timing with her riding. She has now reached a point where she would like to proceed with surgery and comes in to discuss the timing of the surgery and asking about the anterior approach. The hip has been progressive over the past year with increasing stiffness and decreasing range of motion and now night pain. She states that is had become more difficult to get shoes and socks on, getting in and out of the car, and going up and down steps. She continues to  stay active with her riding and also gets on the eliptical three times a week (which helps with her stiffness) for exercise. She still tries to play teenis but this hurts a good bit after coming off the court. She used Aleve and Advil in the past but nowmainly uses Cumarin which does make a difference. On the really bad days, she will even supplement this with an occasional Advil at time. She comes in the office to discuss total hip repalcement for the left hip. She said the right hip which we replaced several years ago is doing great. She is real pleased with that. Left hip starting to get progressively worse. She is having the above mentioned functional issues and having a lot of pain. She is at a stage now where she wants to get the left hip fixed. They have been treated conservatively in the past for the above stated problem and despite conservative measures, they continue to have progressive pain and severe functional limitations and dysfunction. They have failed non-operative management including home exercise, medications. It is felt that they would benefit from undergoing total joint replacement. Risks and benefits of the procedure have been discussed with the patient and they elect to proceed with surgery. There are no active contraindications to surgery such as ongoing infection or rapidly progressive neurological disease.  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  Intolerance Coumadin *ANTICOAGULANTS* Supertherapuetic with last total hip procedure - 2011  Family History Osteoarthritis mother Depression mother Cerebrovascular Accident father Heart disease in female family member before  age 55 Heart Disease father Cancer father Hypertension father  Social History Exercise Exercises daily; does running / walking, other and gym / weights Current work status working full time Drug/Alcohol Rehab (Currently) no Tobacco use never smoker Marital status married Alcohol use current drinker; drinks wine; 5-7 per week Illicit drug use no Number of flights of stairs before winded 2-3 Drug/Alcohol Rehab (Previously) no Living situation live with spouse Pain Contract no Children 0 Post-Surgical Plans Home Advance Directives Living Will, Healthcare POA  Medication History Calcium (Oral) Specific dose unknown - Active. Vitamin D3 (Oral) Specific dose unknown - Active. Fish Oil Active. Magnesium Gluconate (Oral) Specific dose unknown - Active. cumarin Active. Mulitvitamin Active. MSM Active. Vitamin C Active.  Past Surgical History Carpal Tunnel Repair right Total Hip Replacement Date: 08/2010. right Tonsillectomy Colon Polyp Removal - Colonoscopy Left Humerus Surgery for Open Fracture Date: 07/2012.  Review of Systems General Not Present- Chills, Fatigue, Fever, Memory Loss, Night Sweats, Weight Gain and Weight Loss. Skin Not Present- Eczema, Hives, Itching, Lesions and Rash. HEENT Not Present- Dentures, Double Vision, Headache, Hearing Loss, Tinnitus and Visual Loss. Respiratory Not Present- Allergies, Chronic Cough, Coughing up blood, Shortness of breath at rest and Shortness of breath with exertion. Cardiovascular Not Present- Chest Pain, Difficulty Breathing Lying Down, Murmur, Palpitations, Racing/skipping heartbeats and Swelling. Gastrointestinal Not Present- Abdominal Pain, Bloody Stool, Constipation, Diarrhea, Difficulty Swallowing, Heartburn, Jaundice, Loss of appetitie, Nausea and Vomiting. Female Genitourinary Not Present- Blood in Urine, Discharge, Flank Pain, Incontinence, Painful Urination, Urgency, Urinary frequency,  Urinary Retention, Urinating at Night and Weak urinary stream. Musculoskeletal Present- Joint Pain (left hip). Not Present- Back Pain, Joint Swelling, Morning Stiffness, Muscle Pain, Muscle Weakness and Spasms. Neurological Not Present- Blackout spells, Difficulty with balance, Dizziness, Paralysis, Tremor and Weakness. Psychiatric Not Present- Insomnia.  Vitals Weight: 163 lb Height: 71in Weight was reported by patient. Height was reported by patient. Body Surface Area: 1.93 m Body Mass Index: 22.73 kg/m  BP: 128/70 (Sitting, Right Arm, Standard)  Physical Exam General Mental Status -Alert, cooperative and good historian. General Appearance-pleasant, Not in acute distress. Orientation-Oriented X3. Build & Nutrition-Well nourished and Well developed.  Head and Neck Head-normocephalic, atraumatic . Neck Global Assessment - supple, no bruit auscultated on the right, no bruit auscultated on the left.  Eye Pupil - Bilateral-Regular and Round. Motion - Bilateral-EOMI.  Chest and Lung Exam Auscultation Breath sounds - clear at anterior chest wall and clear at posterior chest wall. Adventitious sounds - No Adventitious sounds.  Cardiovascular Auscultation Rhythm - Regular rate and rhythm. Heart Sounds - S1 WNL and S2 WNL. Murmurs & Other Heart Sounds - Auscultation of the heart reveals - No Murmurs.  Abdomen Palpation/Percussion Tenderness - Abdomen is non-tender to palpation. Rigidity (guarding) - Abdomen is soft. Auscultation Auscultation of the abdomen reveals - Bowel sounds normal.  Female Genitourinary Note: Not done, not pertinent to present illness  Musculoskeletal Note: On exam, very pleasant well-developed female, alert and oriented, no apparent distress. Right hip flexes 130, rotate in 30 and out 40, abduct 40 without discomfort. Left hip flexes about 100, rotate in 10 and out 20, abduct 20 with discomfort. Knee exams are normal. Pulse  sensation and motor intact. Antalgic gait pattern on the left.  RADIOGRAPHS AP pelvis and AP and lateral of both hips show   that the prosthesis on the right remains in excellent position, no abnormalities. On the left, she has got dysplasia with about 50% uncovering of her femoral head with bone-on-bone changes in the hip and subchondral cystic changes.  Assessment & Plan  Primary osteoarthritis of left hip (M16.12) Note:Surgical Plans: Left Total Hip Replacement - Anterior Approach  Disposition: Home  PCP: Dr. C. Smith - Patient has been seen preoperatively and felt to be stable for surgery.  IV TXA  Anesthesia Issues: None  Signed electronically by Alezandrew L Solly Derasmo, III PA-C  

## 2015-06-25 NOTE — Transfer of Care (Signed)
Immediate Anesthesia Transfer of Care Note  Patient: Joan Chan  Procedure(s) Performed: Procedure(s): TOTAL LEFT HIP ARTHROPLASTY ANTERIOR APPROACH (Left)  Patient Location: PACU  Anesthesia Type:General  Level of Consciousness: sedated, patient cooperative and responds to stimulation  Airway & Oxygen Therapy: Patient Spontanous Breathing and Patient connected to face mask oxygen  Post-op Assessment: Report given to RN and Post -op Vital signs reviewed and stable  Post vital signs: Reviewed and stable  Last Vitals:  Filed Vitals:   06/25/15 1300  BP: 120/72  Pulse: 60  Temp: 36.9 C  Resp: 18    Complications: No apparent anesthesia complications

## 2015-06-25 NOTE — Anesthesia Procedure Notes (Signed)
Procedure Name: Intubation Date/Time: 06/25/2015 3:49 PM Performed by: Anne Fu Pre-anesthesia Checklist: Patient identified, Emergency Drugs available, Suction available, Patient being monitored and Timeout performed Patient Re-evaluated:Patient Re-evaluated prior to inductionOxygen Delivery Method: Circle system utilized Preoxygenation: Pre-oxygenation with 100% oxygen Intubation Type: IV induction Ventilation: Mask ventilation without difficulty Laryngoscope Size: Mac and 4 Grade View: Grade III Tube type: Oral Tube size: 7.5 mm Number of attempts: 3 (X2 per CRNA X 1 MD Grade III ) Airway Equipment and Method: Stylet and Bougie stylet Placement Confirmation: ETT inserted through vocal cords under direct vision,  positive ETCO2,  CO2 detector and breath sounds checked- equal and bilateral Tube secured with: Tape Dental Injury: Teeth and Oropharynx as per pre-operative assessment

## 2015-06-25 NOTE — Op Note (Signed)
OPERATIVE REPORT  PREOPERATIVE DIAGNOSIS: Osteoarthritis of the Left hip.   POSTOPERATIVE DIAGNOSIS: Osteoarthritis of the Left  hip.   PROCEDURE: Left total hip arthroplasty, anterior approach.   SURGEON: Gaynelle Arabian, MD   ASSISTANT: Arlee Muslim, PA-C  ANESTHESIA:  General  ESTIMATED BLOOD LOSS:-150 ml   DRAINS: Hemovac x1.   COMPLICATIONS: None   CONDITION: PACU - hemodynamically stable.   BRIEF CLINICAL NOTE: Joan Chan is a 64 y.o. female who has advanced end-  stage arthritis of her Left  hip with progressively worsening pain and  dysfunction.The patient has failed nonoperative management and presents for  total hip arthroplasty.   PROCEDURE IN DETAIL: After successful administration of spinal  anesthetic, the traction boots for the [facility] bed were placed on both  feet and the patient was placed onto the [facility] bed, boots placed into the leg  holders. The Left hip was then isolated from the perineum with plastic  drapes and prepped and draped in the usual sterile fashion. ASIS and  greater trochanter were marked and a oblique incision was made, starting  at about 1 cm lateral and 2 cm distal to the ASIS and coursing towards  the anterior cortex of the femur. The skin was cut with a 10 blade  through subcutaneous tissue to the level of the fascia overlying the  tensor fascia lata muscle. The fascia was then incised in line with the  incision at the junction of the anterior third and posterior 2/3rd. The  muscle was teased off the fascia and then the interval between the TFL  and the rectus was developed. The Hohmann retractor was then placed at  the top of the femoral neck over the capsule. The vessels overlying the  capsule were cauterized and the fat on top of the capsule was removed.  A Hohmann retractor was then placed anterior underneath the rectus  femoris to give exposure to the entire anterior capsule. A T-shaped  capsulotomy was performed. The  edges were tagged and the femoral head  was identified.       Osteophytes are removed off the superior acetabulum.  The femoral neck was then cut in situ with an oscillating saw. Traction  was then applied to the left lower extremity utilizing the [facility]  traction. The femoral head was then removed. Retractors were placed  around the acetabulum and then circumferential removal of the labrum was  performed. Osteophytes were also removed. Reaming starts at 45 mm to  medialize and  Increased in 2 mm increments to 51 mm. We reamed in  approximately 40 degrees of abduction, 20 degrees anteversion. A 52 mm  pinnacle acetabular shell was then impacted in anatomic position under  fluoroscopic guidance with excellent purchase. We did not need to place  any additional dome screws. A 32 mm neutral + 4 marathon liner was then  placed into the acetabular shell.       The femoral lift was then placed along the lateral aspect of the femur  just distal to the vastus ridge. The leg was  externally rotated and capsule  was stripped off the inferior aspect of the femoral neck down to the  level of the lesser trochanter, this was done with electrocautery. The femur was lifted after this was performed. The  leg was then placed and extended in adducted position to essentially delivering the femur. We also removed the capsule superiorly and the  piriformis from the piriformis  fossa to gain excellent exposure of the  proximal femur. Rongeur was used to remove some cancellous bone to get  into the lateral portion of the proximal femur for placement of the  initial starter reamer. The starter broaches was placed  the starter broach  and was shown to go down the center of the canal. Broaching  with the  Corail system was then performed starting at size 8, coursing  Up to size 16. A size 16 had excellent torsional and rotational  and axial stability. The trial standard offset neck was then placed  with a 32 + 5 trial  head. The hip was then reduced. We confirmed that  the stem was in the canal both on AP and lateral x-rays. It also has excellent sizing. The hip was reduced with outstanding stability through full extension, full external rotation,  and then flexion in adduction internal rotation. AP pelvis was taken  and the leg lengths were measured and found to be exactly equal. Hip  was then dislocated again and the femoral head and neck removed. The  femoral broach was removed. Size 16 Corail stem with a standard offset  neck was then impacted into the femur following native anteversion. Has  excellent purchase in the canal. Excellent torsional and rotational and  axial stability. It is confirmed to be in the canal on AP and lateral  fluoroscopic views. The 32 + 5 ceramic head was placed and the hip  reduced with outstanding stability. Again AP pelvis was taken and it  confirmed that the leg lengths were equal. The wound was then copiously  irrigated with saline solution and the capsule reattached and repaired  with Ethibond suture. 30 ml of .25% Bupivicaine injected into the capsule and into the edge of the tensor fascia lata as well as subcutaneous tissue. The fascia overlying the tensor fascia lata was  then closed with a running #1 V-Loc. Subcu was closed with interrupted  2-0 Vicryl and subcuticular running 4-0 Monocryl. Incision was cleaned  and dried. Steri-Strips and a bulky sterile dressing applied. Hemovac  drain was hooked to suction and then he was awakened and transported to  recovery in stable condition.        Please note that a surgical assistant was a medical necessity for this procedure to perform it in a safe and expeditious manner. Assistant was necessary to provide appropriate retraction of vital neurovascular structures and to prevent femoral fracture and allow for anatomic placement of the prosthesis.  Gaynelle Arabian, M.D.

## 2015-06-25 NOTE — Interval H&P Note (Signed)
History and Physical Interval Note:  06/25/2015 2:49 PM  Joan Chan  has presented today for surgery, with the diagnosis of OA LEFT HIP   The various methods of treatment have been discussed with the patient and family. After consideration of risks, benefits and other options for treatment, the patient has consented to  Procedure(s): TOTAL LEFT HIP ARTHROPLASTY ANTERIOR APPROACH (Left) as a surgical intervention .  The patient's history has been reviewed, patient examined, no change in status, stable for surgery.  I have reviewed the patient's chart and labs.  Questions were answered to the patient's satisfaction.     Gearlean Alf

## 2015-06-25 NOTE — Anesthesia Preprocedure Evaluation (Signed)
Anesthesia Evaluation  Patient identified by MRN, date of birth, ID band Patient awake    Reviewed: Allergy & Precautions, H&P , NPO status , Patient's Chart, lab work & pertinent test results  Airway Mallampati: II  TM Distance: >3 FB Neck ROM: full    Dental no notable dental hx. (+) Dental Advisory Given   Pulmonary neg pulmonary ROS, asthma ,  breath sounds clear to auscultation  Pulmonary exam normal       Cardiovascular Exercise Tolerance: Good negative cardio ROS Normal cardiovascular examRhythm:regular Rate:Normal     Neuro/Psych negative neurological ROS  negative psych ROS   GI/Hepatic negative GI ROS, Neg liver ROS,   Endo/Other  negative endocrine ROS  Renal/GU negative Renal ROS  negative genitourinary   Musculoskeletal   Abdominal   Peds  Hematology negative hematology ROS (+)   Anesthesia Other Findings   Reproductive/Obstetrics negative OB ROS                             Anesthesia Physical Anesthesia Plan  ASA: II  Anesthesia Plan:    Post-op Pain Management:    Induction:   Airway Management Planned:   Additional Equipment:   Intra-op Plan:   Post-operative Plan:   Informed Consent: I have reviewed the patients History and Physical, chart, labs and discussed the procedure including the risks, benefits and alternatives for the proposed anesthesia with the patient or authorized representative who has indicated his/her understanding and acceptance.   Dental Advisory Given  Plan Discussed with: CRNA  Anesthesia Plan Comments:         Anesthesia Quick Evaluation

## 2015-06-26 ENCOUNTER — Encounter (HOSPITAL_COMMUNITY): Payer: Self-pay | Admitting: Orthopedic Surgery

## 2015-06-26 LAB — CBC
HEMATOCRIT: 35.1 % — AB (ref 36.0–46.0)
Hemoglobin: 12 g/dL (ref 12.0–15.0)
MCH: 31.1 pg (ref 26.0–34.0)
MCHC: 34.2 g/dL (ref 30.0–36.0)
MCV: 90.9 fL (ref 78.0–100.0)
Platelets: 172 10*3/uL (ref 150–400)
RBC: 3.86 MIL/uL — ABNORMAL LOW (ref 3.87–5.11)
RDW: 12.4 % (ref 11.5–15.5)
WBC: 8 10*3/uL (ref 4.0–10.5)

## 2015-06-26 LAB — BASIC METABOLIC PANEL
Anion gap: 6 (ref 5–15)
BUN: 15 mg/dL (ref 6–20)
CALCIUM: 8.8 mg/dL — AB (ref 8.9–10.3)
CO2: 28 mmol/L (ref 22–32)
CREATININE: 0.99 mg/dL (ref 0.44–1.00)
Chloride: 103 mmol/L (ref 101–111)
GFR calc Af Amer: >60 mL/min (ref >60)
GFR calc non Af Amer: 59 mL/min — ABNORMAL LOW (ref >60)
GLUCOSE: 144 mg/dL — AB (ref 65–99)
Potassium: 4.6 mmol/L (ref 3.5–5.1)
Sodium: 137 mmol/L (ref 135–145)

## 2015-06-26 MED ORDER — RIVAROXABAN 10 MG PO TABS: 10.0000 mg | ORAL_TABLET | Freq: Every day | ORAL | Status: AC

## 2015-06-26 MED ORDER — OXYCODONE HCL 5 MG PO TABS: 5.0000 mg | ORAL_TABLET | ORAL | Status: DC | PRN

## 2015-06-26 MED ORDER — TRAMADOL HCL 50 MG PO TABS: 50.0000 mg | ORAL_TABLET | Freq: Four times a day (QID) | ORAL | Status: AC | PRN

## 2015-06-26 MED ORDER — METHOCARBAMOL 500 MG PO TABS: 500.0000 mg | ORAL_TABLET | Freq: Four times a day (QID) | ORAL | Status: AC | PRN

## 2015-06-26 NOTE — Care Management Note (Signed)
Case Management Note  Patient Details  Name: Joan Chan MRN: [id number] Date of Birth: 11-25-1950  Subjective/Objective:                   TOTAL LEFT HIP ARTHROPLASTY ANTERIOR APPROACH (Left) Action/Plan:  Discharge planning Expected Discharge Date:                  Expected Discharge Plan:  [place]  In-House Referral:     Discharge planning Services  CM Consult  Post Acute Care Choice:  Home Health Choice offered to:     DME Arranged:  N/A DME Agency:  NA  HH Arranged:  PT HH Agency:  [facility]  Status of Service:  Completed, signed off  Medicare Important Message Given:    Date Medicare IM Given:    Medicare IM give by:    Date Additional Medicare IM Given:    Additional Medicare Important Message give by:     If discussed at [place] of Stay Meetings, dates discussed:    Additional Comments: CM met with pt in room to offer choice of home health agency.  Pt chooses [facility] to render HHPT.  Address and contact information verified by pt.  Referral emailed to [employer], Tim.  Pt has both a rolling walker and a 3n1 at home forma previous surgery.  No other CM needs communicated.   Dellie Catholic, RN 06/26/2015, 9:27 AM

## 2015-06-26 NOTE — Progress Notes (Signed)
Utilization review completed.  

## 2015-06-26 NOTE — Progress Notes (Signed)
   Subjective: 1 Day Post-Op Procedure(s) (LRB): TOTAL LEFT HIP ARTHROPLASTY ANTERIOR APPROACH (Left) Patient reports pain as mild.   Patient seen in rounds for Dr. Wynelle Link.  She dangled on the side of the bed last night. Patient is well, but has had some minor complaints of pain in the hip, requiring pain medications Patient will likely be ready to go home later this afternoon following therapy.  Will setup discharge for later today.  Objective: Vital signs in last 24 hours: Temp:  [97.7 F (36.5 C)-98.5 F (36.9 C)] 98.2 F (36.8 C) (09/01 0423) Pulse Rate:  [51-60] 58 (09/01 0423) Resp:  [11-18] 16 (09/01 0423) BP: (102-145)/(42-72) 104/55 mmHg (09/01 0423) SpO2:  [97 %-100 %] 98 % (09/01 0423) Weight:  [78.019 kg (172 lb)] 78.019 kg (172 lb) (08/31 1836)  Intake/Output from previous day:  Intake/Output Summary (Last 24 hours) at 06/26/15 0850 Last data filed at 06/26/15 0838  Gross per 24 hour  Intake   3190 ml  Output   2380 ml  Net    810 ml    Intake/Output this shift: Total I/O In: -  Out: 600 [Urine:600]  Labs:  Recent Labs  06/26/15 0445  HGB 12.0    Recent Labs  06/26/15 0445  WBC 8.0  RBC 3.86*  HCT 35.1*  PLT 172    Recent Labs  06/26/15 0445  NA 137  K 4.6  CL 103  CO2 28  BUN 15  CREATININE 0.99  GLUCOSE 144*  CALCIUM 8.8*   No results for input(s): LABPT, INR in the last 72 hours.  EXAM: General - Patient is Alert, Appropriate and Oriented Extremity - Neurovascular intact Sensation intact distally Dorsiflexion/Plantar flexion intact Dressing - clean, dry Motor Function - intact, moving foot and toes well on exam.  HV pulled.  Assessment/Plan: 1 Day Post-Op Procedure(s) (LRB): TOTAL LEFT HIP ARTHROPLASTY ANTERIOR APPROACH (Left) Procedure(s) (LRB): TOTAL LEFT HIP ARTHROPLASTY ANTERIOR APPROACH (Left) Past Medical History  Diagnosis Date  . Family history of adverse reaction to anesthesia     FATHER HAD PROBLEM WITH  INTUBATION - PT HAS NOT HAD ANY PROBLEMS  . Asthma     HAS NOT USED INHALER X 3-4 YRS  . Arthritis   . Osteoporosis     "ONLY IN MY HIPS"  . History of nonmelanoma skin cancer   . History of kidney stones   . History of transfusion   . Cancer     HX SKIN CANCER   Principal Problem:   OA (osteoarthritis) of hip  Estimated body mass index is 24 kg/(m^2) as calculated from the following:   Height as of this encounter: 5\' 11"  (1.803 m).   Weight as of this encounter: 78.019 kg (172 lb). Advance diet Up with therapy Discharge home with home health this PM if does well. Diet - Regular diet Follow up - in 2 weeks Activity - WBAT Disposition - Home Condition Upon Discharge - Good D/C Meds - See DC Summary DVT Prophylaxis - Xarelto  Joan Muslim, PA-C Orthopaedic Surgery 06/26/2015, 8:50 AM

## 2015-06-26 NOTE — Discharge Instructions (Addendum)
° °Dr. Frank Aluisio °Total Joint Specialist °[facility] °[address] °[place], [place] [address] °[phone] ° °ANTERIOR APPROACH TOTAL HIP REPLACEMENT POSTOPERATIVE DIRECTIONS ° ° °Hip Rehabilitation, Guidelines Following Surgery  °The results of a hip operation are greatly improved after range of motion and muscle strengthening exercises. Follow all safety measures which are given to protect your hip. If any of these exercises cause increased pain or swelling in your joint, decrease the amount until you are comfortable again. Then slowly increase the exercises. Call your caregiver if you have problems or questions.  ° °HOME CARE INSTRUCTIONS  °Remove items at home which could result in a fall. This includes throw rugs or furniture in walking pathways.  °· ICE to the affected hip every three hours for 30 minutes at a time and then as needed for pain and swelling.  Continue to use ice on the hip for pain and swelling from surgery. You may notice swelling that will progress down to the foot and ankle.  This is normal after surgery.  Elevate the leg when you are not up walking on it.   °· Continue to use the breathing machine which will help keep your temperature down.  It is common for your temperature to cycle up and down following surgery, especially at night when you are not up moving around and exerting yourself.  The breathing machine keeps your lungs expanded and your temperature down. ° ° °DIET °You may resume your previous home diet once your are discharged from the hospital. ° °DRESSING / WOUND CARE / SHOWERING °You may shower 3 days after surgery, but keep the wounds dry during showering.  You may use an occlusive plastic wrap (Press'n Seal for example), NO SOAKING/SUBMERGING IN THE BATHTUB.  If the bandage gets wet, change with a clean dry gauze.  If the incision gets wet, pat the wound dry with a clean towel. °You may start showering once you are discharged home but do not  submerge the incision under water. Just pat the incision dry and apply a dry gauze dressing on daily. °Change the surgical dressing daily and reapply a dry dressing each time. ° °ACTIVITY °Walk with your walker as instructed. °Use walker as long as suggested by your caregivers. °Avoid periods of inactivity such as sitting longer than an hour when not asleep. This helps prevent blood clots.  °You may resume a sexual relationship in one month or when given the OK by your doctor.  °You may return to work once you are cleared by your doctor.  °Do not drive a car for 6 weeks or until released by you surgeon.  °Do not drive while taking narcotics. ° °WEIGHT BEARING °Weight bearing as tolerated with assist device (walker, cane, etc) as directed, use it as long as suggested by your surgeon or therapist, typically at least 4-6 weeks. ° °POSTOPERATIVE CONSTIPATION PROTOCOL °Constipation - defined medically as fewer than three stools per week and severe constipation as less than one stool per week. ° °One of the most common issues patients have following surgery is constipation.  Even if you have a regular bowel pattern at home, your normal regimen is likely to be disrupted due to multiple reasons following surgery.  Combination of anesthesia, postoperative narcotics, change in appetite and fluid intake all can affect your bowels.  In order to avoid complications following surgery, here are some recommendations in order to help you during your recovery period. ° °Colace (docusate) - Pick up an over-the-counter   form of Colace or another stool softener and take twice a day as long as you are requiring postoperative pain medications.  Take with a full glass of water daily.  If you experience loose stools or diarrhea, hold the colace until you stool forms back up.  If your symptoms do not get better within 1 week or if they get worse, check with your doctor. ° °Dulcolax (bisacodyl) - Pick up over-the-counter and take as directed  by the product packaging as needed to assist with the movement of your bowels.  Take with a full glass of water.  Use this product as needed if not relieved by Colace only.  ° °MiraLax (polyethylene glycol) - Pick up over-the-counter to have on hand.  MiraLax is a solution that will increase the amount of water in your bowels to assist with bowel movements.  Take as directed and can mix with a glass of water, juice, soda, coffee, or tea.  Take if you go more than two days without a movement. °Do not use MiraLax more than once per day. Call your doctor if you are still constipated or irregular after using this medication for 7 days in a row. ° °If you continue to have problems with postoperative constipation, please contact the office for further assistance and recommendations.  If you experience "the worst abdominal pain ever" or develop nausea or vomiting, please contact the office immediatly for further recommendations for treatment. ° °ITCHING ° If you experience itching with your medications, try taking only a single pain pill, or even half a pain pill at a time.  You can also use Benadryl over the counter for itching or also to help with sleep.  ° °TED HOSE STOCKINGS °Wear the elastic stockings on both legs for three weeks following surgery during the day but you may remove then at night for sleeping. ° °MEDICATIONS °See your medication summary on the “After Visit Summary” that the nursing staff will review with you prior to discharge.  You may have some home medications which will be placed on hold until you complete the course of blood thinner medication.  It is important for you to complete the blood thinner medication as prescribed by your surgeon.  Continue your approved medications as instructed at time of discharge. ° °PRECAUTIONS °If you experience chest pain or shortness of breath - call 911 immediately for transfer to the hospital emergency department.  °If you develop a fever greater that 101 F,  purulent drainage from wound, increased redness or drainage from wound, foul odor from the wound/dressing, or calf pain - CONTACT YOUR SURGEON.   °                                                °FOLLOW-UP APPOINTMENTS °Make sure you keep all of your appointments after your operation with your surgeon and caregivers. You should call the office at the above phone number and make an appointment for approximately two weeks after the date of your surgery or on the date instructed by your surgeon outlined in the "After Visit Summary". ° °RANGE OF MOTION AND STRENGTHENING EXERCISES  °These exercises are designed to help you keep full movement of your hip joint. Follow your caregiver's or physical therapist's instructions. Perform all exercises about fifteen times, three times per day or as directed. Exercise both hips, even if you   have had only one joint replacement. These exercises can be done on a training (exercise) mat, on the floor, on a table or on a bed. Use whatever works the best and is most comfortable for you. Use music or television while you are exercising so that the exercises are a pleasant break in your day. This will make your life better with the exercises acting as a break in routine you can look forward to.  Lying on your back, slowly slide your foot toward your buttocks, raising your knee up off the floor. Then slowly slide your foot back down until your leg is straight again.  Lying on your back spread your legs as far apart as you can without causing discomfort.  Lying on your side, raise your upper leg and foot straight up from the floor as far as is comfortable. Slowly lower the leg and repeat.  Lying on your back, tighten up the muscle in the front of your thigh (quadriceps muscles). You can do this by keeping your leg straight and trying to raise your heel off the floor. This helps strengthen the largest muscle supporting your knee.  Lying on your back, tighten up the muscles of your  buttocks both with the legs straight and with the knee bent at a comfortable angle while keeping your heel on the floor.   IF YOU ARE TRANSFERRED TO A SKILLED REHAB FACILITY If the patient is transferred to a skilled rehab facility following release from the hospital, a list of the current medications will be sent to the facility for the patient to continue.  When discharged from the skilled rehab facility, please have the facility set up the patient's [place] prior to being released. Also, the skilled facility will be responsible for providing the patient with their medications at time of release from the facility to include their pain medication, the muscle relaxants, and their blood thinner medication. If the patient is still at the rehab facility at time of the two week follow up appointment, the skilled rehab facility will also need to assist the patient in arranging follow up appointment in our office and any transportation needs.  MAKE SURE YOU:  Understand these instructions.  Get help right away if you are not doing well or get worse.    Pick up stool softner and laxative for home use following surgery while on pain medications. Do not submerge incision under water. Please use good hand washing techniques while changing dressing each day. May shower starting three days after surgery. Please use a clean towel to pat the incision dry following showers. Continue to use ice for pain and swelling after surgery. Do not use any lotions or creams on the incision until instructed by your surgeon.  Take Xarelto for two and a half more weeks, then discontinue Xarelto. Once the patient has completed the blood thinner regimen, then take a Baby 81 mg Aspirin daily for three more weeks. _______________  Information on my medicine - XARELTO (Rivaroxaban)  This medication education was reviewed with me or my healthcare representative as part of my discharge preparation.    Why  was Xarelto prescribed for you? Xarelto was prescribed for you to reduce the risk of blood clots forming after orthopedic surgery. The medical term for these abnormal blood clots is venous thromboembolism (VTE).  What do you need to know about xarelto ? Take your Xarelto ONCE DAILY at the same time every day. You may take it either with or  without food.  If you have difficulty swallowing the tablet whole, you may crush it and mix in applesauce just prior to taking your dose.  Take Xarelto exactly as prescribed by your doctor and DO NOT stop taking Xarelto without talking to the doctor who prescribed the medication.  Stopping without other VTE prevention medication to take the place of Xarelto may increase your risk of developing a clot.  After discharge, you should have regular check-up appointments with your healthcare provider that is prescribing your Xarelto.    What do you do if you miss a dose? If you miss a dose, take it as soon as you remember on the same day then continue your regularly scheduled once daily regimen the next day. Do not take two doses of Xarelto on the same day.   Important Safety Information A possible side effect of Xarelto is bleeding. You should call your healthcare provider right away if you experience any of the following: ? Bleeding from an injury or your nose that does not stop. ? Unusual colored urine (red or dark brown) or unusual colored stools (red or black). ? Unusual bruising for unknown reasons. ? A serious fall or if you hit your head (even if there is no bleeding).  Some medicines may interact with Xarelto and might increase your risk of bleeding while on Xarelto. To help avoid this, consult your healthcare provider or pharmacist prior to using any new prescription or non-prescription medications, including herbals, vitamins, non-steroidal anti-inflammatory drugs (NSAIDs) and supplements.  This website has more information on Xarelto:  [email or web address]

## 2015-06-26 NOTE — Discharge Summary (Signed)
Physician Discharge Summary   Patient ID: Joan Chan MRN: [id number] DOB/AGE: 07-04-1951 64 y.o.  Admit date: 06/25/2015 Discharge date: 06/26/2015  Primary Diagnosis:  Osteoarthritis of the Left hip.   Admission Diagnoses:  Past Medical History  Diagnosis Date  . Family history of adverse reaction to anesthesia     FATHER HAD PROBLEM WITH INTUBATION - PT HAS NOT HAD ANY PROBLEMS  . Asthma     HAS NOT USED INHALER X 3-4 YRS  . Arthritis   . Osteoporosis     "ONLY IN MY HIPS"  . History of nonmelanoma skin cancer   . History of kidney stones   . History of transfusion   . Cancer     HX SKIN CANCER   Discharge Diagnoses:   Principal Problem:   OA (osteoarthritis) of hip  Estimated body mass index is 24 kg/(m^2) as calculated from the following:   Height as of this encounter: $RemoveBeforeD'5\' 11"'[id number]$  (1.803 m).   Weight as of this encounter: 78.019 kg (172 lb).  Procedure(s) (LRB): TOTAL LEFT HIP ARTHROPLASTY ANTERIOR APPROACH (Left)   Consults: None  HPI: Joan Chan is a 64 y.o. female who has advanced end-  stage arthritis of her Left hip with progressively worsening pain and  dysfunction.The patient has failed nonoperative management and presents for  total hip arthroplasty.   Laboratory Data: Admission on 06/25/2015  Component Date Value Ref Range Status  . ABO/RH(D) 06/25/2015 O POS   Final  . Antibody Screen 06/25/2015 POS   Final  . Sample Expiration 06/25/2015 06/28/2015   Final  . DAT, IgG 06/25/2015 NEG   Final  . Unit Number 06/25/2015 [id number]   Final  . Blood Component Type 06/25/2015 RED CELLS,LR   Final  . Unit division 06/25/2015 00   Final  . Status of Unit 06/25/2015 ALLOCATED   Final  . Donor AG Type 06/25/2015 NEGATIVE FOR E ANTIGEN   Final  . Transfusion Status 06/25/2015 OK TO TRANSFUSE   Final  . Crossmatch Result 06/25/2015 COMPATIBLE   Final  . Unit Number 06/25/2015 [id number]   Final  . Blood Component Type 06/25/2015 RED CELLS,LR    Final  . Unit division 06/25/2015 00   Final  . Status of Unit 06/25/2015 ALLOCATED   Final  . Donor AG Type 06/25/2015 NEGATIVE FOR E ANTIGEN   Final  . Transfusion Status 06/25/2015 OK TO TRANSFUSE   Final  . Crossmatch Result 06/25/2015 COMPATIBLE   Final  . WBC 06/26/2015 8.0  4.0 - 10.5 K/uL Final  . RBC 06/26/2015 3.86* 3.87 - 5.11 MIL/uL Final  . Hemoglobin 06/26/2015 12.0  12.0 - 15.0 g/dL Final  . HCT 06/26/2015 35.1* 36.0 - 46.0 % Final  . MCV 06/26/2015 90.9  78.0 - 100.0 fL Final  . MCH 06/26/2015 31.1  26.0 - 34.0 pg Final  . MCHC 06/26/2015 34.2  30.0 - 36.0 g/dL Final  . RDW 06/26/2015 12.4  11.5 - 15.5 % Final  . Platelets 06/26/2015 172  150 - 400 K/uL Final  . Sodium 06/26/2015 137  135 - 145 mmol/L Final  . Potassium 06/26/2015 4.6  3.5 - 5.1 mmol/L Final  . Chloride 06/26/2015 103  101 - 111 mmol/L Final  . CO2 06/26/2015 28  22 - 32 mmol/L Final  . Glucose, Bld 06/26/2015 144* 65 - 99 mg/dL Final  . BUN 06/26/2015 15  6 - 20 mg/dL Final  . Creatinine, Ser 06/26/2015 0.99  0.44 - 1.00 mg/dL  Final  . Calcium 06/26/2015 8.8* 8.9 - 10.3 mg/dL Final  . GFR calc non Af Amer 06/26/2015 59* >60 mL/min Final  . GFR calc Af Amer 06/26/2015 >60  >60 mL/min Final   Comment: (NOTE) The eGFR has been calculated using the CKD EPI equation. This calculation has not been validated in all clinical situations. eGFR's persistently <60 mL/min signify possible Chronic Kidney Disease.   Georgiann Hahn gap 06/26/2015 6  5 - 15 Final  Hospital Outpatient Visit on 06/20/2015  Component Date Value Ref Range Status  . MRSA, PCR 06/20/2015 NEGATIVE  NEGATIVE Final  . Staphylococcus aureus 06/20/2015 NEGATIVE  NEGATIVE Final   Comment:        The Xpert SA Assay (FDA approved for NASAL specimens in patients over 16 years of age), is one component of a comprehensive surveillance program.  Test performance has been validated by [facility] for patients greater than or equal to 36 year  old. It is not intended to diagnose infection nor to guide or monitor treatment.   Marland Kitchen aPTT 06/20/2015 30  24 - 37 seconds Final  . WBC 06/20/2015 5.2  4.0 - 10.5 K/uL Final  . RBC 06/20/2015 4.37  3.87 - 5.11 MIL/uL Final  . Hemoglobin 06/20/2015 13.3  12.0 - 15.0 g/dL Final  . HCT 06/20/2015 39.8  36.0 - 46.0 % Final  . MCV 06/20/2015 91.1  78.0 - 100.0 fL Final  . MCH 06/20/2015 30.4  26.0 - 34.0 pg Final  . MCHC 06/20/2015 33.4  30.0 - 36.0 g/dL Final  . RDW 06/20/2015 13.2  11.5 - 15.5 % Final  . Platelets 06/20/2015 202  150 - 400 K/uL Final  . Sodium 06/20/2015 139  135 - 145 mmol/L Final  . Potassium 06/20/2015 4.6  3.5 - 5.1 mmol/L Final  . Chloride 06/20/2015 106  101 - 111 mmol/L Final  . CO2 06/20/2015 24  22 - 32 mmol/L Final  . Glucose, Bld 06/20/2015 105* 65 - 99 mg/dL Final  . BUN 06/20/2015 18  6 - 20 mg/dL Final  . Creatinine, Ser 06/20/2015 0.91  0.44 - 1.00 mg/dL Final  . Calcium 06/20/2015 9.1  8.9 - 10.3 mg/dL Final  . Total Protein 06/20/2015 6.9  6.5 - 8.1 g/dL Final  . Albumin 06/20/2015 4.2  3.5 - 5.0 g/dL Final  . AST 06/20/2015 29  15 - 41 U/L Final  . ALT 06/20/2015 19  14 - 54 U/L Final  . Alkaline Phosphatase 06/20/2015 90  38 - 126 U/L Final  . Total Bilirubin 06/20/2015 0.6  0.3 - 1.2 mg/dL Final  . GFR calc non Af Amer 06/20/2015 >60  >60 mL/min Final  . GFR calc Af Amer 06/20/2015 >60  >60 mL/min Final   Comment: (NOTE) The eGFR has been calculated using the CKD EPI equation. This calculation has not been validated in all clinical situations. eGFR's persistently <60 mL/min signify possible Chronic Kidney Disease.   . Anion gap 06/20/2015 9  5 - 15 Final  . Prothrombin Time 06/20/2015 13.6  11.6 - 15.2 seconds Final  . INR 06/20/2015 1.02  0.00 - 1.49 Final  . ABO/RH(D) 06/20/2015 O POS   Final  . Antibody Screen 06/20/2015 POS   Final  . Sample Expiration 06/20/2015 06/23/2015   Final  . DAT, IgG 06/20/2015 NEG   Final  . Antibody  Identification 12/17/3610 ANTI E   Final  . PT AG Type 06/20/2015 NEGATIVE FOR E ANTIGEN   Final  .  Color, Urine 06/20/2015 YELLOW  YELLOW Final  . APPearance 06/20/2015 CLEAR  CLEAR Final  . Specific Gravity, Urine 06/20/2015 1.020  1.005 - 1.030 Final  . pH 06/20/2015 5.5  5.0 - 8.0 Final  . Glucose, UA 06/20/2015 NEGATIVE  NEGATIVE mg/dL Final  . Hgb urine dipstick 06/20/2015 NEGATIVE  NEGATIVE Final  . Bilirubin Urine 06/20/2015 NEGATIVE  NEGATIVE Final  . Ketones, ur 06/20/2015 NEGATIVE  NEGATIVE mg/dL Final  . Protein, ur 06/20/2015 NEGATIVE  NEGATIVE mg/dL Final  . Urobilinogen, UA 06/20/2015 0.2  0.0 - 1.0 mg/dL Final  . Nitrite 06/20/2015 NEGATIVE  NEGATIVE Final  . Leukocytes, UA 06/20/2015 NEGATIVE  NEGATIVE Final   MICROSCOPIC NOT DONE ON URINES WITH NEGATIVE PROTEIN, BLOOD, LEUKOCYTES, NITRITE, OR GLUCOSE <1000 mg/dL.     X-Rays:Dg Pelvis Portable  06/25/2015   CLINICAL DATA:  Left hip replacement  EXAM: PORTABLE PELVIS 1-2 VIEWS  COMPARISON:  None.  FINDINGS: Interval left total hip arthroplasty without failure or complication. No fracture or dislocation. Postsurgical changes in the soft tissues surrounding the left hip with surgical drains in place.  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  IMPRESSION: Interval left total hip arthroplasty.   Electronically Signed   By: Kathreen Devoid   On: 06/25/2015 17:50   Dg C-arm 1-60 Min-no Report  06/25/2015   CLINICAL DATA: left anterior hip   C-ARM 1-60 MINUTES  Fluoroscopy was utilized by the requesting physician.  No radiographic  interpretation.     EKG:No orders found for this or any previous visit.   Hospital Course: Patient was admitted to [facility] and taken to the OR and underwent the above state procedure without complications.  Patient tolerated the procedure well and was later transferred to the recovery room and then to the orthopaedic floor for postoperative care.  They were given PO  and IV analgesics for pain control following their surgery.  They were given 24 hours of postoperative antibiotics of  Anti-infectives    Start     Dose/Rate Route Frequency Ordered Stop   06/26/15 0600  vancomycin (VANCOCIN) IVPB 1000 mg/200 mL premix     1,000 mg 200 mL/hr over 60 Minutes Intravenous On call to O.R. 06/25/15 1301 06/25/15 1503   06/25/15 2100  ceFAZolin (ANCEF) IVPB 2 g/50 mL premix     2 g 100 mL/hr over 30 Minutes Intravenous Every 6 hours 06/25/15 1841 06/26/15 0430     and started on DVT prophylaxis in the form of Xarelto.   PT and OT were ordered for total hip protocol.  The patient was allowed to be WBAT with therapy. Discharge planning was consulted to help with postop disposition and equipment needs.  Patient had a good night on the evening of surgery.  They started to get up OOB with therapy on day one.  Hemovac drain was pulled without difficulty. Patient was seen in rounds and was ready to go home later that same day after therapy sessions.  Diet - Regular diet Follow up - in 2 weeks Activity - WBAT Disposition - Home Condition Upon Discharge - Good D/C Meds - See DC Summary DVT Prophylaxis - Xarelto  Discharge Instructions    Call MD / Call 911    Complete by:  As directed   If you experience chest pain or shortness of breath, CALL 911 and be transported to the hospital emergency room.  If you develope a fever above 101 F, pus (white drainage) or increased drainage  or redness at the wound, or calf pain, call your surgeon's office.     Change dressing    Complete by:  As directed   You may change your dressing dressing daily with sterile 4 x 4 inch gauze dressing and paper tape.  Do not submerge the incision under water.     Constipation Prevention    Complete by:  As directed   Drink plenty of fluids.  Prune juice may be helpful.  You may use a stool softener, such as Colace (over the counter) 100 mg twice a day.  Use MiraLax (over the counter) for  constipation as needed.     Diet general    Complete by:  As directed      Discharge instructions    Complete by:  As directed   Pick up stool softner and laxative for home use following surgery while on pain medications. Do not submerge incision under water. Please use good hand washing techniques while changing dressing each day. May shower starting three days after surgery. Please use a clean towel to pat the incision dry following showers. Continue to use ice for pain and swelling after surgery. Do not use any lotions or creams on the incision until instructed by your surgeon.  Total Hip Protocol.  Take Xarelto for two and a half more weeks, then discontinue Xarelto. Once the patient has completed the blood thinner regimen, then take a Baby 81 mg Aspirin daily for three more weeks.  Postoperative Constipation Protocol  Constipation - defined medically as fewer than three stools per week and severe constipation as less than one stool per week.  One of the most common issues patients have following surgery is constipation.  Even if you have a regular bowel pattern at home, your normal regimen is likely to be disrupted due to multiple reasons following surgery.  Combination of anesthesia, postoperative narcotics, change in appetite and fluid intake all can affect your bowels.  In order to avoid complications following surgery, here are some recommendations in order to help you during your recovery period.  Colace (docusate) - Pick up an over-the-counter form of Colace or another stool softener and take twice a day as long as you are requiring postoperative pain medications.  Take with a full glass of water daily.  If you experience loose stools or diarrhea, hold the colace until you stool forms back up.  If your symptoms do not get better within 1 week or if they get worse, check with your doctor.  Dulcolax (bisacodyl) - Pick up over-the-counter and take as directed by the product  packaging as needed to assist with the movement of your bowels.  Take with a full glass of water.  Use this product as needed if not relieved by Colace only.   MiraLax (polyethylene glycol) - Pick up over-the-counter to have on hand.  MiraLax is a solution that will increase the amount of water in your bowels to assist with bowel movements.  Take as directed and can mix with a glass of water, juice, soda, coffee, or tea.  Take if you go more than two days without a movement. Do not use MiraLax more than once per day. Call your doctor if you are still constipated or irregular after using this medication for 7 days in a row.  If you continue to have problems with postoperative constipation, please contact the office for further assistance and recommendations.  If you experience "the worst abdominal pain ever" or develop nausea or  vomiting, please contact the office immediatly for further recommendations for treatment.     Do not sit on low chairs, stoools or toilet seats, as it may be difficult to get up from low surfaces    Complete by:  As directed      Driving restrictions    Complete by:  As directed   No driving until released by the physician.     Increase activity slowly as tolerated    Complete by:  As directed      Lifting restrictions    Complete by:  As directed   No lifting until released by the physician.     Patient may shower    Complete by:  As directed   You may shower without a dressing once there is no drainage.  Do not wash over the wound.  If drainage remains, do not shower until drainage stops.     TED hose    Complete by:  As directed   Use stockings (TED hose) for 3 weeks on both leg(s).  You may remove them at night for sleeping.     Weight bearing as tolerated    Complete by:  As directed   Laterality:  left  Extremity:  Lower            Medication List    STOP taking these medications        CALCIUM + D PO     clindamycin 300 MG capsule  Commonly known  as:  CLEOCIN     MSM PO     multivitamin with minerals Tabs tablet     naproxen sodium 220 MG tablet  Commonly known as:  ANAPROX     OMEGA 3 PO     TURMERIC PO     vitamin C 1000 MG tablet     Vitamin D3 5000 UNITS Tabs      TAKE these medications        magnesium oxide 400 MG tablet  Commonly known as:  MAG-OX  Take 400 mg by mouth daily.     methocarbamol 500 MG tablet  Commonly known as:  ROBAXIN  Take 1 tablet (500 mg total) by mouth every 6 (six) hours as needed for muscle spasms.     oxyCODONE 5 MG immediate release tablet  Commonly known as:  Oxy IR/ROXICODONE  Take 1-2 tablets (5-10 mg total) by mouth every 3 (three) hours as needed for moderate pain or severe pain.     rivaroxaban 10 MG Tabs tablet  Commonly known as:  XARELTO  Take 1 tablet (10 mg total) by mouth daily with breakfast. Take Xarelto for two and a half more weeks, then discontinue Xarelto. Once the patient has completed the blood thinner regimen, then take a Baby 81 mg Aspirin daily for three more weeks.     traMADol 50 MG tablet  Commonly known as:  ULTRAM  Take 1-2 tablets (50-100 mg total) by mouth every 6 (six) hours as needed (mild pain).           Follow-up Information    Follow up with Gearlean Alf, MD. Schedule an appointment as soon as possible for a visit on 07/08/2015.   Specialty:  Orthopedic Surgery   Why:  Call office at [phone] to setup appointment on Tuesday 9/13 with Dr. Wynelle Link.   Contact information:   [address] [place] [address] [phone]       Signed: Arlee Muslim, PA-C Orthopaedic Surgery 06/26/2015, 8:58  AM    

## 2015-06-26 NOTE — Evaluation (Signed)
Physical Therapy Evaluation Patient Details Name: Joan Chan MRN: [id number] DOB: 1950/11/23 Today's Date: 06/26/2015   History of Present Illness  Joan Chan  Clinical Impression  Patient is very mobile POD !. Patient will benefit from PT to address problems listed in note below to DC to home soon.    Follow Up Recommendations Home health PT;Supervision for mobility/OOB    Equipment Recommendations  None recommended by PT    Recommendations for Other Services       Precautions / Restrictions Precautions Precautions: Fall      Mobility  Bed Mobility Overal bed mobility: Needs Assistance Bed Mobility: Supine to Sit     Supine to sit: Supervision        Transfers Overall transfer level: Needs assistance Equipment used: Rolling walker (2 wheeled) Transfers: Sit to/from Stand Sit to Stand: Supervision            Ambulation/Gait Ambulation/Gait assistance: Supervision Ambulation Distance (Feet): 200 Feet Assistive device: Rolling walker (2 wheeled) Gait Pattern/deviations: Step-through pattern;Antalgic     General Gait Details: cues for sequence  Stairs            Wheelchair Mobility    Modified Rankin (Stroke Patients Only)       Balance                                             Pertinent Vitals/Pain Pain Assessment: 0-10 Pain Score: 1  Pain Location: Joan hip Pain Descriptors / Indicators: Sore Pain Intervention(s): Monitored during session;Premedicated before session;Ice applied    Home Living Family/patient expects to be discharged to:: Private residence Living Arrangements: Spouse/significant other Available Help at Discharge: Family Type of Home: House Home Access: Stairs to enter   Technical brewer of Steps: 1 Home Layout: One level Home Equipment: Environmental consultant - 2 wheels;Cane - single point;Bedside commode;Adaptive equipment      Prior Function Level of Independence: Independent                Hand Dominance        Extremity/Trunk Assessment               Lower Extremity Assessment: LLE deficits/detail   LLE Deficits / Details: able to advance  Cervical / Trunk Assessment: Normal  Communication   Communication: No difficulties  Cognition Arousal/Alertness: Awake/alert Behavior During Therapy: WFL for tasks assessed/performed Overall Cognitive Status: Within Functional Limits for tasks assessed                      General Comments      Exercises Total Joint Exercises Ankle Circles/Pumps: Both;10 reps Quad Sets: Both;10 reps Short Arc Quad: AROM;Left;10 reps Heel Slides: AROM;Left;10 reps Hip ABduction/ADduction: AROM;Left;10 reps      Assessment/Plan    PT Assessment Patient needs continued PT services  PT Diagnosis Difficulty walking   PT Problem List Decreased strength;Decreased range of motion;Decreased activity tolerance;Decreased mobility;Pain;Decreased knowledge of precautions;Decreased knowledge of use of DME;Decreased safety awareness  PT Treatment Interventions DME instruction;Gait training;Stair training;Functional mobility training;Therapeutic activities;Therapeutic exercise;Patient/family education   PT Goals (Current goals can be found in the Care Plan section) Acute Rehab PT Goals Patient Stated Goal: to ride my horses with not pain PT Goal Formulation: With patient Time For Goal Achievement: 06/28/15 Potential to Achieve Goals: Good    Frequency 7X/week   Barriers to discharge  Co-evaluation               End of Session   Activity Tolerance: Patient tolerated treatment well Patient left: in chair;with call bell/phone within reach Nurse Communication: Mobility status         Time: 1694-5038 PT Time Calculation (min) (ACUTE ONLY): 28 min   Charges:   PT Evaluation $Initial PT Evaluation Tier I: 1 Procedure PT Treatments $Gait Training: 8-22 mins   PT G Codes:        Joan Chan PT [phone]  06/26/2015, 9:06 AM

## 2015-06-26 NOTE — Progress Notes (Addendum)
Physical Therapy Treatment Patient Details Name: EMANIE BEHAN MRN: [id number] DOB: 08-28-1951 Today's Date: 06/26/2015    History of Present Illness [name]    PT Comments    Pt is  Doing very well. For DC today  Follow Up Recommendations  Home health PT;Supervision for mobility/OOB     Equipment Recommendations       Recommendations for Other Services       Precautions / Restrictions Precautions Precautions: Fall    Mobility  Bed Mobility                  Transfers Overall transfer level: Modified independent Equipment used: Rolling walker (2 wheeled)                Ambulation/Gait Ambulation/Gait assistance: Modified independent (Device/Increase time) Ambulation Distance (Feet): 200 Feet Assistive device: Rolling walker (2 wheeled) Gait Pattern/deviations: Step-through pattern     General Gait Details: cues for sequence   Stairs Stairs: Yes Stairs assistance: Min guard Stair Management: Step to pattern;Forwards;With walker;No rails Number of Stairs: 1 General stair comments: cues for safety  Wheelchair Mobility    Modified Rankin (Stroke Patients Only)       Balance                                    Cognition Arousal/Alertness: Awake/alert                          Exercises  standing hip flexion, abduction,extension,knee flexion.    General Comments        Pertinent Vitals/Pain Pain Score: 1  Pain Location: L hip Pain Descriptors / Indicators: Sore    Home Living                      Prior Function            PT Goals (current goals can now be found in the care plan section) Progress towards PT goals: Progressing toward goals    Frequency       PT Plan Current plan remains appropriate    Co-evaluation             End of Session   Activity Tolerance: Patient tolerated treatment well Patient left: in chair;with call bell/phone within reach     Time: 1345-1414 PT  Time Calculation (min) (ACUTE ONLY): 29 min  Charges:  $Gait Training: 23-37 mins                    G Codes:      Claretha Cooper 06/26/2015, 2:30 PM

## 2015-06-26 NOTE — Progress Notes (Signed)
OT Cancellation Note  Patient Details Name: JALEIAH ASAY MRN: [id number] DOB: 1951/04/17   Cancelled Treatment:    Reason Eval/Treat Not Completed: OT screened, no needs identified, will sign off  Lutie Pickler 06/26/2015, 9:52 AM  Lesle Chris, OTR/L [phone] 06/26/2015

## 2015-06-27 DIAGNOSIS — M810 Age-related osteoporosis without current pathological fracture: Secondary | ICD-10-CM | POA: Diagnosis not present

## 2015-06-27 DIAGNOSIS — Z96653 Presence of artificial knee joint, bilateral: Secondary | ICD-10-CM | POA: Diagnosis not present

## 2015-06-27 DIAGNOSIS — Z471 Aftercare following joint replacement surgery: Secondary | ICD-10-CM | POA: Diagnosis not present

## 2015-06-27 DIAGNOSIS — J45909 Unspecified asthma, uncomplicated: Secondary | ICD-10-CM | POA: Diagnosis not present

## 2015-06-29 LAB — TYPE AND SCREEN
ABO/RH(D): O POS
ANTIBODY SCREEN: POSITIVE
DAT, IgG: NEGATIVE
DONOR AG TYPE: NEGATIVE
DONOR AG TYPE: NEGATIVE
Unit division: 0
Unit division: 0

## 2015-07-02 DIAGNOSIS — J45909 Unspecified asthma, uncomplicated: Secondary | ICD-10-CM | POA: Diagnosis not present

## 2015-07-02 DIAGNOSIS — Z471 Aftercare following joint replacement surgery: Secondary | ICD-10-CM | POA: Diagnosis not present

## 2015-07-02 DIAGNOSIS — Z96653 Presence of artificial knee joint, bilateral: Secondary | ICD-10-CM | POA: Diagnosis not present

## 2015-07-02 DIAGNOSIS — M810 Age-related osteoporosis without current pathological fracture: Secondary | ICD-10-CM | POA: Diagnosis not present

## 2015-07-03 DIAGNOSIS — Z471 Aftercare following joint replacement surgery: Secondary | ICD-10-CM | POA: Diagnosis not present

## 2015-07-03 DIAGNOSIS — M810 Age-related osteoporosis without current pathological fracture: Secondary | ICD-10-CM | POA: Diagnosis not present

## 2015-07-03 DIAGNOSIS — J45909 Unspecified asthma, uncomplicated: Secondary | ICD-10-CM | POA: Diagnosis not present

## 2015-07-03 DIAGNOSIS — Z96653 Presence of artificial knee joint, bilateral: Secondary | ICD-10-CM | POA: Diagnosis not present

## 2015-07-04 DIAGNOSIS — M810 Age-related osteoporosis without current pathological fracture: Secondary | ICD-10-CM | POA: Diagnosis not present

## 2015-07-04 DIAGNOSIS — Z96653 Presence of artificial knee joint, bilateral: Secondary | ICD-10-CM | POA: Diagnosis not present

## 2015-07-04 DIAGNOSIS — Z471 Aftercare following joint replacement surgery: Secondary | ICD-10-CM | POA: Diagnosis not present

## 2015-07-04 DIAGNOSIS — J45909 Unspecified asthma, uncomplicated: Secondary | ICD-10-CM | POA: Diagnosis not present

## 2015-07-07 DIAGNOSIS — D485 Neoplasm of uncertain behavior of skin: Secondary | ICD-10-CM | POA: Diagnosis not present

## 2015-07-07 DIAGNOSIS — Z411 Encounter for cosmetic surgery: Secondary | ICD-10-CM | POA: Diagnosis not present

## 2015-07-07 DIAGNOSIS — L570 Actinic keratosis: Secondary | ICD-10-CM | POA: Diagnosis not present

## 2015-07-07 DIAGNOSIS — D044 Carcinoma in situ of skin of scalp and neck: Secondary | ICD-10-CM | POA: Diagnosis not present

## 2015-07-08 DIAGNOSIS — Z96642 Presence of left artificial hip joint: Secondary | ICD-10-CM | POA: Diagnosis not present

## 2015-07-08 DIAGNOSIS — Z471 Aftercare following joint replacement surgery: Secondary | ICD-10-CM | POA: Diagnosis not present

## 2015-07-09 DIAGNOSIS — Z471 Aftercare following joint replacement surgery: Secondary | ICD-10-CM | POA: Diagnosis not present

## 2015-07-09 DIAGNOSIS — Z96653 Presence of artificial knee joint, bilateral: Secondary | ICD-10-CM | POA: Diagnosis not present

## 2015-07-09 DIAGNOSIS — M810 Age-related osteoporosis without current pathological fracture: Secondary | ICD-10-CM | POA: Diagnosis not present

## 2015-07-09 DIAGNOSIS — J45909 Unspecified asthma, uncomplicated: Secondary | ICD-10-CM | POA: Diagnosis not present

## 2015-07-17 DIAGNOSIS — D044 Carcinoma in situ of skin of scalp and neck: Secondary | ICD-10-CM | POA: Diagnosis not present

## 2015-07-22 DIAGNOSIS — D485 Neoplasm of uncertain behavior of skin: Secondary | ICD-10-CM | POA: Diagnosis not present

## 2015-07-22 DIAGNOSIS — Z85828 Personal history of other malignant neoplasm of skin: Secondary | ICD-10-CM | POA: Diagnosis not present

## 2015-07-22 DIAGNOSIS — D1800 Hemangioma unspecified site: Secondary | ICD-10-CM | POA: Diagnosis not present

## 2015-07-22 DIAGNOSIS — L814 Other melanin hyperpigmentation: Secondary | ICD-10-CM | POA: Diagnosis not present

## 2015-07-22 DIAGNOSIS — L821 Other seborrheic keratosis: Secondary | ICD-10-CM | POA: Diagnosis not present

## 2015-07-22 DIAGNOSIS — L570 Actinic keratosis: Secondary | ICD-10-CM | POA: Diagnosis not present

## 2015-07-23 DIAGNOSIS — L570 Actinic keratosis: Secondary | ICD-10-CM | POA: Diagnosis not present

## 2015-07-29 DIAGNOSIS — Z96642 Presence of left artificial hip joint: Secondary | ICD-10-CM | POA: Diagnosis not present

## 2015-07-29 DIAGNOSIS — Z471 Aftercare following joint replacement surgery: Secondary | ICD-10-CM | POA: Diagnosis not present

## 2015-08-04 DIAGNOSIS — L570 Actinic keratosis: Secondary | ICD-10-CM | POA: Diagnosis not present

## 2015-09-08 DIAGNOSIS — Z23 Encounter for immunization: Secondary | ICD-10-CM | POA: Diagnosis not present

## 2015-09-09 DIAGNOSIS — Z96642 Presence of left artificial hip joint: Secondary | ICD-10-CM | POA: Diagnosis not present

## 2015-09-09 DIAGNOSIS — Z471 Aftercare following joint replacement surgery: Secondary | ICD-10-CM | POA: Diagnosis not present

## 2015-10-24 DIAGNOSIS — M542 Cervicalgia: Secondary | ICD-10-CM | POA: Diagnosis not present

## 2015-10-24 DIAGNOSIS — M9901 Segmental and somatic dysfunction of cervical region: Secondary | ICD-10-CM | POA: Diagnosis not present

## 2015-10-29 DIAGNOSIS — M542 Cervicalgia: Secondary | ICD-10-CM | POA: Diagnosis not present

## 2015-10-29 DIAGNOSIS — M9901 Segmental and somatic dysfunction of cervical region: Secondary | ICD-10-CM | POA: Diagnosis not present

## 2015-11-03 DIAGNOSIS — M9901 Segmental and somatic dysfunction of cervical region: Secondary | ICD-10-CM | POA: Diagnosis not present

## 2015-11-03 DIAGNOSIS — M542 Cervicalgia: Secondary | ICD-10-CM | POA: Diagnosis not present

## 2015-11-10 DIAGNOSIS — M9901 Segmental and somatic dysfunction of cervical region: Secondary | ICD-10-CM | POA: Diagnosis not present

## 2015-11-10 DIAGNOSIS — M542 Cervicalgia: Secondary | ICD-10-CM | POA: Diagnosis not present

## 2015-11-17 DIAGNOSIS — M9901 Segmental and somatic dysfunction of cervical region: Secondary | ICD-10-CM | POA: Diagnosis not present

## 2015-11-17 DIAGNOSIS — M542 Cervicalgia: Secondary | ICD-10-CM | POA: Diagnosis not present

## 2015-11-24 DIAGNOSIS — M542 Cervicalgia: Secondary | ICD-10-CM | POA: Diagnosis not present

## 2015-11-24 DIAGNOSIS — M9901 Segmental and somatic dysfunction of cervical region: Secondary | ICD-10-CM | POA: Diagnosis not present

## 2016-01-14 DIAGNOSIS — H903 Sensorineural hearing loss, bilateral: Secondary | ICD-10-CM | POA: Diagnosis not present

## 2016-01-14 DIAGNOSIS — H6123 Impacted cerumen, bilateral: Secondary | ICD-10-CM | POA: Diagnosis not present

## 2016-01-14 DIAGNOSIS — H6983 Other specified disorders of Eustachian tube, bilateral: Secondary | ICD-10-CM | POA: Diagnosis not present

## 2016-01-21 DIAGNOSIS — H1032 Unspecified acute conjunctivitis, left eye: Secondary | ICD-10-CM | POA: Diagnosis not present

## 2016-03-05 DIAGNOSIS — M1711 Unilateral primary osteoarthritis, right knee: Secondary | ICD-10-CM | POA: Diagnosis not present

## 2016-03-15 DIAGNOSIS — Z1231 Encounter for screening mammogram for malignant neoplasm of breast: Secondary | ICD-10-CM | POA: Diagnosis not present

## 2016-03-15 DIAGNOSIS — M85832 Other specified disorders of bone density and structure, left forearm: Secondary | ICD-10-CM | POA: Diagnosis not present

## 2016-04-12 DIAGNOSIS — H35362 Drusen (degenerative) of macula, left eye: Secondary | ICD-10-CM | POA: Diagnosis not present

## 2016-04-12 DIAGNOSIS — H25013 Cortical age-related cataract, bilateral: Secondary | ICD-10-CM | POA: Diagnosis not present

## 2016-04-12 DIAGNOSIS — H40013 Open angle with borderline findings, low risk, bilateral: Secondary | ICD-10-CM | POA: Diagnosis not present

## 2016-04-12 DIAGNOSIS — H2513 Age-related nuclear cataract, bilateral: Secondary | ICD-10-CM | POA: Diagnosis not present

## 2016-04-23 DIAGNOSIS — M170 Bilateral primary osteoarthritis of knee: Secondary | ICD-10-CM | POA: Diagnosis not present

## 2016-04-26 DIAGNOSIS — M1711 Unilateral primary osteoarthritis, right knee: Secondary | ICD-10-CM | POA: Diagnosis not present

## 2016-04-29 DIAGNOSIS — L570 Actinic keratosis: Secondary | ICD-10-CM | POA: Diagnosis not present

## 2016-04-29 DIAGNOSIS — Z411 Encounter for cosmetic surgery: Secondary | ICD-10-CM | POA: Diagnosis not present

## 2016-04-30 DIAGNOSIS — Z4789 Encounter for other orthopedic aftercare: Secondary | ICD-10-CM | POA: Diagnosis not present

## 2016-04-30 DIAGNOSIS — S72451D Displaced supracondylar fracture without intracondylar extension of lower end of right femur, subsequent encounter for closed fracture with routine healing: Secondary | ICD-10-CM | POA: Diagnosis not present

## 2016-06-30 ENCOUNTER — Ambulatory Visit: Payer: Self-pay | Admitting: Orthopedic Surgery

## 2016-07-12 NOTE — Patient Instructions (Signed)
Joan Chan  07/12/2016   Your procedure is scheduled on: 07/21/2016    Report to [facility] Main  Entrance take [place]  elevators to 3rd floor to  [place] at    0700 AM.  Call this number if you have problems the morning of surgery [phone]   Remember: ONLY 1 PERSON MAY GO WITH YOU TO SHORT STAY TO GET  READY MORNING OF YOUR SURGERY.  Do not eat food or drink liquids :After Midnight.     Take these medicines the morning of surgery with A SIP OF WATER: oxycodone if needed                                 You may not have any metal on your body including hair pins and              piercings  Do not wear jewelry, make-up, lotions, powders or perfumes, deodorant             Do not wear nail polish.  Do not shave  48 hours prior to surgery.     Do not bring valuables to the hospital. [place].  Contacts, dentures or bridgework may not be worn into surgery.      Patients discharged the day of surgery will not be allowed to drive home.  Name and phone number of your driver:  Special Instructions: N/A              Please read over the following fact sheets you were given: _____________________________________________________________________             [facility] - Preparing for Surgery Before surgery, you can play an important role.  Because skin is not sterile, your skin needs to be as free of germs as possible.  You can reduce the number of germs on your skin by washing with CHG (chlorahexidine gluconate) soap before surgery.  CHG is an antiseptic cleaner which kills germs and bonds with the skin to continue killing germs even after washing. Please DO NOT use if you have an allergy to CHG or antibacterial soaps.  If your skin becomes reddened/irritated stop using the CHG and inform your nurse when you arrive at Short Stay. Do not shave (including legs and underarms) for at least 48 hours prior  to the first CHG shower.  You may shave your face/neck. Please follow these instructions carefully:  1.  Shower with CHG Soap the night before surgery and the  morning of Surgery.  2.  If you choose to wash your hair, wash your hair first as usual with your  normal  shampoo.  3.  After you shampoo, rinse your hair and body thoroughly to remove the  shampoo.                           4.  Use CHG as you would any other liquid soap.  You can apply chg directly  to the skin and wash                       Gently with a scrungie or clean washcloth.  5.  Apply the  CHG Soap to your body ONLY FROM THE NECK DOWN.   Do not use on face/ open                           Wound or open sores. Avoid contact with eyes, ears mouth and genitals (private parts).                       Wash face,  Genitals (private parts) with your normal soap.             6.  Wash thoroughly, paying special attention to the area where your surgery  will be performed.  7.  Thoroughly rinse your body with warm water from the neck down.  8.  DO NOT shower/wash with your normal soap after using and rinsing off  the CHG Soap.                9.  Pat yourself dry with a clean towel.            10.  Wear clean pajamas.            11.  Place clean sheets on your bed the night of your first shower and do not  sleep with pets. Day of Surgery : Do not apply any lotions/deodorants the morning of surgery.  Please wear clean clothes to the hospital/surgery center.  FAILURE TO FOLLOW THESE INSTRUCTIONS MAY RESULT IN THE CANCELLATION OF YOUR SURGERY PATIENT SIGNATURE_________________________________  NURSE SIGNATURE__________________________________  ________________________________________________________________________

## 2016-07-13 ENCOUNTER — Encounter (INDEPENDENT_AMBULATORY_CARE_PROVIDER_SITE_OTHER): Payer: Self-pay

## 2016-07-13 ENCOUNTER — Encounter (HOSPITAL_COMMUNITY)
Admission: RE | Admit: 2016-07-13 | Discharge: 2016-07-13 | Disposition: A | Discharge status: Home / Self Care | Payer: Medicare Other | Source: Ambulatory Visit | Attending: Orthopedic Surgery | Admitting: Orthopedic Surgery

## 2016-07-13 ENCOUNTER — Encounter (HOSPITAL_COMMUNITY): Payer: Self-pay

## 2016-07-13 DIAGNOSIS — Z01818 Encounter for other preprocedural examination: Secondary | ICD-10-CM | POA: Insufficient documentation

## 2016-07-13 LAB — CBC
HCT: 42.5 % (ref 36.0–46.0)
HEMOGLOBIN: 14 g/dL (ref 12.0–15.0)
MCH: 30.2 pg (ref 26.0–34.0)
MCHC: 32.9 g/dL (ref 30.0–36.0)
MCV: 91.6 fL (ref 78.0–100.0)
PLATELETS: 223 10*3/uL (ref 150–400)
RBC: 4.64 MIL/uL (ref 3.87–5.11)
RDW: 13.8 % (ref 11.5–15.5)
WBC: 5.7 10*3/uL (ref 4.0–10.5)

## 2016-07-13 LAB — SURGICAL PCR SCREEN
MRSA, PCR: NEGATIVE
STAPHYLOCOCCUS AUREUS: NEGATIVE

## 2016-07-14 DIAGNOSIS — Z0000 Encounter for general adult medical examination without abnormal findings: Secondary | ICD-10-CM | POA: Diagnosis not present

## 2016-07-14 DIAGNOSIS — Z136 Encounter for screening for cardiovascular disorders: Secondary | ICD-10-CM | POA: Diagnosis not present

## 2016-07-14 DIAGNOSIS — Z1389 Encounter for screening for other disorder: Secondary | ICD-10-CM | POA: Diagnosis not present

## 2016-07-14 DIAGNOSIS — Z1159 Encounter for screening for other viral diseases: Secondary | ICD-10-CM | POA: Diagnosis not present

## 2016-07-14 DIAGNOSIS — E559 Vitamin D deficiency, unspecified: Secondary | ICD-10-CM | POA: Diagnosis not present

## 2016-07-20 DIAGNOSIS — S83289A Other tear of lateral meniscus, current injury, unspecified knee, initial encounter: Secondary | ICD-10-CM | POA: Diagnosis present

## 2016-07-20 NOTE — H&P (Signed)
CC- Joan Chan is a 65 y.o. female who presents with right knee pain.  HPI- . Knee Pain: Patient presents with knee pain involving the  right knee. Onset of the symptoms was several months ago. Inciting event: none known. Current symptoms include giving out, pain located laterally and stiffness. Pain is aggravated by lateral movements, pivoting, rising after sitting and squatting.  Patient has had no prior knee problems. Evaluation to date: MRI: abnormal lateral meniscal tear. Treatment to date: corticosteroid injection which was not very effective.  Past Medical History:  Diagnosis Date  . Arthritis   . Asthma    HAS NOT USED INHALER X 3-4 YRS  . Cancer (HCC)    HX SKIN CANCER  . Family history of adverse reaction to anesthesia    FATHER HAD PROBLEM WITH INTUBATION - PT HAS NOT HAD ANY PROBLEMS  . History of kidney stones   . History of nonmelanoma skin cancer   . History of transfusion   . Osteoporosis    "ONLY IN MY HIPS"    Past Surgical History:  Procedure Laterality Date  . CARPAL TUNNEL RELEASE     RIGHT  . COLONOSCOPY W/ POLYPECTOMY    . FRACTURE SURGERY  2013   LEFT HUMERUS  . JOINT REPLACEMENT  08/2010   TOTAL RT HIP  . TONSILLECTOMY    . TOTAL HIP ARTHROPLASTY Left 06/25/2015   Procedure: TOTAL LEFT HIP ARTHROPLASTY ANTERIOR APPROACH;  Surgeon: Gaynelle Arabian, MD;  Location: [facility] ORS;  Service: Orthopedics;  Laterality: Left;    Prior to Admission medications   Medication Sig Start Date End Date Taking? Authorizing Provider  Calcium Carbonate-Vit D-Min (CALCIUM 1200 PO) Take 1,200 mg by mouth at bedtime.    Historical Provider, MD  cholecalciferol (VITAMIN D) 1000 units tablet Take 1,000 Units by mouth daily.    Historical Provider, MD  clindamycin (CLEOCIN) 300 MG capsule Take 600 mg by mouth once. Before dental treatment    Historical Provider, MD  magnesium oxide (MAG-OX) 400 MG tablet Take 400 mg by mouth daily.    Historical Provider, MD  methocarbamol  (ROBAXIN) 500 MG tablet Take 1 tablet (500 mg total) by mouth every 6 (six) hours as needed for muscle spasms. Patient not taking: Reported on 07/12/2016 06/26/15   Arlee Muslim, PA-C  oxyCODONE (OXY IR/ROXICODONE) 5 MG immediate release tablet Take 1-2 tablets (5-10 mg total) by mouth every 3 (three) hours as needed for moderate pain or severe pain. 06/26/15   Arlee Muslim, PA-C  rivaroxaban (XARELTO) 10 MG TABS tablet Take 1 tablet (10 mg total) by mouth daily with breakfast. Take Xarelto for two and a half more weeks, then discontinue Xarelto. Once the patient has completed the blood thinner regimen, then take a Baby 81 mg Aspirin daily for three more weeks. Patient not taking: Reported on 07/12/2016 06/26/15   Arlee Muslim, PA-C  traMADol (ULTRAM) 50 MG tablet Take 1-2 tablets (50-100 mg total) by mouth every 6 (six) hours as needed (mild pain). Patient not taking: Reported on 07/12/2016 06/26/15   Arlee Muslim, PA-C    KNEE EXAM Antalgic gait, lateral joint line tenderness, no effusion, collateral ligaments intact, negative pivot shift  Physical Examination: General appearance - alert, well appearing, and in no distress Mental status - alert, oriented to person, place, and time Chest - clear to auscultation, no wheezes, rales or rhonchi, symmetric air entry Heart - normal rate, regular rhythm, normal S1, S2, no murmurs, rubs, clicks or gallops Abdomen -  soft, nontender, nondistended, no masses or organomegaly    Asessment/Plan--- Right knee lateral meniscal tear- - Plan right knee arthroscopy with meniscal debridement. Procedure risks and potential comps discussed with patient who elects to proceed. Goals are decreased pain and increased function with a high likelihood of achieving both

## 2016-07-21 ENCOUNTER — Ambulatory Visit (HOSPITAL_COMMUNITY): Payer: Medicare Other | Admitting: Certified Registered"

## 2016-07-21 ENCOUNTER — Encounter (HOSPITAL_COMMUNITY): Payer: Self-pay | Admitting: Certified Registered"

## 2016-07-21 ENCOUNTER — Ambulatory Visit (HOSPITAL_COMMUNITY)
Admission: RE | Admit: 2016-07-21 | Discharge: 2016-07-21 | Disposition: A | Discharge status: Home / Self Care | Payer: Medicare Other | Source: Ambulatory Visit | Attending: Orthopedic Surgery | Admitting: Orthopedic Surgery

## 2016-07-21 ENCOUNTER — Encounter (HOSPITAL_COMMUNITY)
Admission: RE | Disposition: A | Discharge status: Home / Self Care | Payer: Self-pay | Source: Ambulatory Visit | Attending: Orthopedic Surgery

## 2016-07-21 DIAGNOSIS — M23351 Other meniscus derangements, posterior horn of lateral meniscus, right knee: Secondary | ICD-10-CM | POA: Diagnosis not present

## 2016-07-21 DIAGNOSIS — Z96642 Presence of left artificial hip joint: Secondary | ICD-10-CM | POA: Insufficient documentation

## 2016-07-21 DIAGNOSIS — Z85828 Personal history of other malignant neoplasm of skin: Secondary | ICD-10-CM | POA: Diagnosis not present

## 2016-07-21 DIAGNOSIS — J45909 Unspecified asthma, uncomplicated: Secondary | ICD-10-CM | POA: Insufficient documentation

## 2016-07-21 DIAGNOSIS — S83281A Other tear of lateral meniscus, current injury, right knee, initial encounter: Secondary | ICD-10-CM

## 2016-07-21 DIAGNOSIS — M1990 Unspecified osteoarthritis, unspecified site: Secondary | ICD-10-CM | POA: Insufficient documentation

## 2016-07-21 DIAGNOSIS — M23362 Other meniscus derangements, other lateral meniscus, left knee: Secondary | ICD-10-CM | POA: Diagnosis not present

## 2016-07-21 DIAGNOSIS — S83289A Other tear of lateral meniscus, current injury, unspecified knee, initial encounter: Secondary | ICD-10-CM | POA: Diagnosis present

## 2016-07-21 DIAGNOSIS — M23261 Derangement of other lateral meniscus due to old tear or injury, right knee: Secondary | ICD-10-CM | POA: Insufficient documentation

## 2016-07-21 DIAGNOSIS — M23300 Other meniscus derangements, unspecified lateral meniscus, right knee: Secondary | ICD-10-CM | POA: Diagnosis not present

## 2016-07-21 DIAGNOSIS — Z79899 Other long term (current) drug therapy: Secondary | ICD-10-CM | POA: Insufficient documentation

## 2016-07-21 DIAGNOSIS — M1711 Unilateral primary osteoarthritis, right knee: Secondary | ICD-10-CM | POA: Diagnosis not present

## 2016-07-21 HISTORY — PX: KNEE ARTHROSCOPY: SHX127

## 2016-07-21 SURGERY — ARTHROSCOPY, KNEE
Anesthesia: General | Laterality: Right

## 2016-07-21 MED ORDER — DEXAMETHASONE SODIUM PHOSPHATE 10 MG/ML IJ SOLN
10.0000 mg | Freq: Once | INTRAMUSCULAR | Status: AC
  Administered 2016-07-21: 10 mg via INTRAVENOUS

## 2016-07-21 MED ORDER — FENTANYL CITRATE (PF) 100 MCG/2ML IJ SOLN
INTRAMUSCULAR | Status: AC
  Filled 2016-07-21: qty 2

## 2016-07-21 MED ORDER — FENTANYL CITRATE (PF) 100 MCG/2ML IJ SOLN
INTRAMUSCULAR | Status: DC | PRN
  Administered 2016-07-21: 50 ug via INTRAVENOUS
  Administered 2016-07-21 (×2): 25 ug via INTRAVENOUS

## 2016-07-21 MED ORDER — ONDANSETRON HCL 4 MG/2ML IJ SOLN
INTRAMUSCULAR | Status: DC | PRN
  Administered 2016-07-21: 4 mg via INTRAVENOUS

## 2016-07-21 MED ORDER — LIDOCAINE 2% (20 MG/ML) 5 ML SYRINGE
INTRAMUSCULAR | Status: AC
  Filled 2016-07-21: qty 5

## 2016-07-21 MED ORDER — PROPOFOL 10 MG/ML IV BOLUS
INTRAVENOUS | Status: DC | PRN
  Administered 2016-07-21: 180 mg via INTRAVENOUS

## 2016-07-21 MED ORDER — HYDROMORPHONE HCL 1 MG/ML IJ SOLN
INTRAMUSCULAR | Status: AC
  Filled 2016-07-21: qty 1

## 2016-07-21 MED ORDER — CEFAZOLIN SODIUM-DEXTROSE 2-4 GM/100ML-% IV SOLN
2.0000 g | INTRAVENOUS | Status: AC
  Administered 2016-07-21: 2 g via INTRAVENOUS
  Filled 2016-07-21: qty 100

## 2016-07-21 MED ORDER — POVIDONE-IODINE 10 % EX SWAB: 2.0000 "application " | Freq: Once | CUTANEOUS | Status: DC

## 2016-07-21 MED ORDER — LACTATED RINGERS IV SOLN
INTRAVENOUS | Status: DC
  Administered 2016-07-21: 08:00:00 via INTRAVENOUS

## 2016-07-21 MED ORDER — MIDAZOLAM HCL 5 MG/5ML IJ SOLN
INTRAMUSCULAR | Status: DC | PRN
  Administered 2016-07-21: 2 mg via INTRAVENOUS

## 2016-07-21 MED ORDER — HYDROMORPHONE HCL 1 MG/ML IJ SOLN
INTRAMUSCULAR | Status: AC
  Administered 2016-07-21: 0.5 mg via INTRAVENOUS
  Filled 2016-07-21: qty 1

## 2016-07-21 MED ORDER — ACETAMINOPHEN 10 MG/ML IV SOLN
INTRAVENOUS | Status: AC
  Filled 2016-07-21: qty 100

## 2016-07-21 MED ORDER — CEFAZOLIN SODIUM-DEXTROSE 2-4 GM/100ML-% IV SOLN
INTRAVENOUS | Status: AC
  Filled 2016-07-21: qty 100

## 2016-07-21 MED ORDER — LACTATED RINGERS IR SOLN
Status: DC | PRN
  Administered 2016-07-21: 6000 mL

## 2016-07-21 MED ORDER — BUPIVACAINE HCL (PF) 0.25 % IJ SOLN
INTRAMUSCULAR | Status: AC
  Filled 2016-07-21: qty 30

## 2016-07-21 MED ORDER — HYDROCODONE-ACETAMINOPHEN 5-325 MG PO TABS: 1.0000 | ORAL_TABLET | ORAL | 0 refills | Status: AC | PRN

## 2016-07-21 MED ORDER — ONDANSETRON HCL 4 MG/2ML IJ SOLN
INTRAMUSCULAR | Status: AC
  Filled 2016-07-21: qty 2

## 2016-07-21 MED ORDER — HYDROMORPHONE HCL 1 MG/ML IJ SOLN
0.2500 mg | INTRAMUSCULAR | Status: DC | PRN
Start: 2016-07-21 — End: 2016-07-21
  Administered 2016-07-21 (×3): 0.5 mg via INTRAVENOUS

## 2016-07-21 MED ORDER — ACETAMINOPHEN 10 MG/ML IV SOLN
1000.0000 mg | Freq: Once | INTRAVENOUS | Status: AC
  Administered 2016-07-21: 1000 mg via INTRAVENOUS
  Filled 2016-07-21: qty 100

## 2016-07-21 MED ORDER — CHLORHEXIDINE GLUCONATE 4 % EX LIQD: 60.0000 mL | Freq: Once | CUTANEOUS | Status: DC

## 2016-07-21 MED ORDER — BUPIVACAINE-EPINEPHRINE 0.25% -1:200000 IJ SOLN
INTRAMUSCULAR | Status: DC | PRN
  Administered 2016-07-21: 20 mL

## 2016-07-21 MED ORDER — MIDAZOLAM HCL 2 MG/2ML IJ SOLN
INTRAMUSCULAR | Status: AC
  Filled 2016-07-21: qty 2

## 2016-07-21 MED ORDER — DEXAMETHASONE SODIUM PHOSPHATE 10 MG/ML IJ SOLN
INTRAMUSCULAR | Status: AC
  Filled 2016-07-21: qty 1

## 2016-07-21 MED ORDER — LIDOCAINE 2% (20 MG/ML) 5 ML SYRINGE
INTRAMUSCULAR | Status: DC | PRN
  Administered 2016-07-21: 50 mg via INTRAVENOUS

## 2016-07-21 MED ORDER — PROPOFOL 10 MG/ML IV BOLUS
INTRAVENOUS | Status: AC
  Filled 2016-07-21: qty 20

## 2016-07-21 SURGICAL SUPPLY — 28 items
BANDAGE ACE 6X5 VEL STRL LF (GAUZE/BANDAGES/DRESSINGS) ×2 IMPLANT
BLADE 4.2CUDA (BLADE) ×2 IMPLANT
BNDG CONFORM 6X.82 1P STRL (GAUZE/BANDAGES/DRESSINGS) ×2 IMPLANT
COVER SURGICAL LIGHT HANDLE (MISCELLANEOUS) ×2 IMPLANT
CUFF TOURN SGL QUICK 34 (TOURNIQUET CUFF) ×1
CUFF TRNQT CYL 34X4X40X1 (TOURNIQUET CUFF) ×1 IMPLANT
DRAPE U-SHAPE 47X51 STRL (DRAPES) ×2 IMPLANT
DRSG EMULSION OIL 3X3 NADH (GAUZE/BANDAGES/DRESSINGS) ×2 IMPLANT
DRSG PAD ABDOMINAL 8X10 ST (GAUZE/BANDAGES/DRESSINGS) ×2 IMPLANT
DURAPREP 26ML APPLICATOR (WOUND CARE) ×2 IMPLANT
GAUZE SPONGE 4X4 12PLY STRL (GAUZE/BANDAGES/DRESSINGS) ×2 IMPLANT
GLOVE BIO SURGEON STRL SZ8 (GLOVE) ×2 IMPLANT
GLOVE BIOGEL PI IND STRL 8 (GLOVE) ×1 IMPLANT
GLOVE BIOGEL PI INDICATOR 8 (GLOVE) ×1
GOWN STRL REUS W/TWL LRG LVL3 (GOWN DISPOSABLE) ×2 IMPLANT
KIT BASIN OR (CUSTOM PROCEDURE TRAY) ×2 IMPLANT
MANIFOLD NEPTUNE II (INSTRUMENTS) ×2 IMPLANT
MARKER SKIN DUAL TIP RULER LAB (MISCELLANEOUS) ×2 IMPLANT
PACK ARTHROSCOPY WL (CUSTOM PROCEDURE TRAY) ×2 IMPLANT
PACK ICE MAXI GEL EZY WRAP (MISCELLANEOUS) ×6 IMPLANT
PAD ABD 8X10 STRL (GAUZE/BANDAGES/DRESSINGS) ×2 IMPLANT
PADDING CAST COTTON 6X4 STRL (CAST SUPPLIES) ×4 IMPLANT
POSITIONER SURGICAL ARM (MISCELLANEOUS) ×2 IMPLANT
SUT ETHILON 4 0 PS 2 18 (SUTURE) ×2 IMPLANT
TOWEL OR 17X26 10 PK STRL BLUE (TOWEL DISPOSABLE) ×2 IMPLANT
TUBING ARTHRO INFLOW-ONLY STRL (TUBING) ×2 IMPLANT
WAND HAND CNTRL MULTIVAC 90 (MISCELLANEOUS) IMPLANT
WRAP KNEE MAXI GEL POST OP (GAUZE/BANDAGES/DRESSINGS) ×2 IMPLANT

## 2016-07-21 NOTE — Anesthesia Procedure Notes (Signed)
Procedure Name: LMA Insertion Date/Time: 07/21/2016 8:51 AM Performed by: Noralyn Pick D Pre-anesthesia Checklist: Patient identified, Emergency Drugs available, Suction available and Patient being monitored Patient Re-evaluated:Patient Re-evaluated prior to inductionOxygen Delivery Method: Circle system utilized Preoxygenation: Pre-oxygenation with 100% oxygen Intubation Type: IV induction Ventilation: Mask ventilation without difficulty LMA Size: 4.0 Tube type: Oral Number of attempts: 1 Placement Confirmation: positive ETCO2 and breath sounds checked- equal and bilateral Tube secured with: Tape Dental Injury: Teeth and Oropharynx as per pre-operative assessment

## 2016-07-21 NOTE — Interval H&P Note (Signed)
History and Physical Interval Note:  07/21/2016 8:42 AM  Joan Chan  has presented today for surgery, with the diagnosis of LEFT KNEE LATERAL MENISCUS TEAR  The various methods of treatment have been discussed with the patient and family. After consideration of risks, benefits and other options for treatment, the patient has consented to  Procedure(s): RIGHT ARTHROSCOPY KNEE WITH MENISCUS DEBRIDMENT (Right) as a surgical intervention .  The patient's history has been reviewed, patient examined, no change in status, stable for surgery.  I have reviewed the patient's chart and labs.  Questions were answered to the patient's satisfaction.     Gearlean Alf

## 2016-07-21 NOTE — Op Note (Signed)
Operative Report- KNEE ARTHROSCOPY  Preoperative diagnosis-  Right knee lateral meniscal tear  Postoperative diagnosis Right- knee lateral meniscal tear plus  Chondral defect  Procedure- Right knee arthroscopy with lateral  meniscal debridement and chondroplasty   Surgeon- Dione Plover. Vondell Babers, MD  Anesthesia-General  EBL-  Minimal  Complications- None  Condition- PACU - hemodynamically stable.  Brief clinical note- -Joan Chan is a 65 y.o.  female with a several month history of Right pain and mechanical symptoms. Exam and history suggested lateral  meniscal tear confirmed by MRI. The patient presents now for arthroscopy and debridement   Procedure in detail -       After successful administration of General anesthetic, a tourmiquet is placed high on the right thigh and the Right lower extremity is prepped and draped in the usual sterile fashion. Time out is performed by the surgical team. Standard superomedial and inferolateral portal sites are marked and incisions made with an 11 blade. The inflow cannula is passed through the superomedial portal and camera through the inferolateral portal and inflow is initiated. Arthroscopic visualization proceeds.      The undersurface of the patella and trochlea are visualized and they are normal. The medial and lateral gutters are visualized and there are  no loose bodies. Flexion and valgus force is applied to the knee and the medial compartment is entered. A spinal needle is passed into the joint through the site marked for the inferomedial portal. A small incision is made and the dilator passed into the joint. The findings for the medial compartment are normal .     The intercondylar notch is visualized and the ACL appears normal . The lateral compartment is entered and the findings are unstable tear of body of lateral meniscus with 1 x 1 cm chondral defect lateral femoral condyle and 1 x 1 cm area of exposed bone lateral most aspect of tibial  plateau. The tear is debrided to a stable base with baskets and a shaver and sealed off with the Arthrocare. The shaver is used to debride the unstable cartilage to a stable bony base with stable edges. It is probed and found to be stable.The exposed bone is abraded with the shaver.     The joint is again inspected and there are no other tears, defects or loose bodies identified. The arthroscopic equipment is then removed from the inferior portals which are closed with interrupted 4-0 nylon. 20 ml of .25% Marcaine with epinephrine are injected through the inflow cannula and the cannula is then removed and the portal closed with nylon. The incisions are cleaned and dried and a bulky sterile dressing is applied. The patient is then awakened and transported to recovery in stable condition.   07/21/2016, 9:34 AM

## 2016-07-21 NOTE — Transfer of Care (Signed)
Immediate Anesthesia Transfer of Care Note  Patient: Joan Chan  Procedure(s) Performed: Procedure(s): RIGHT ARTHROSCOPY KNEE WITH LATERAL MENISCUS DEBRIDMENT AND CHONDROPLASTY (Right)  Patient Location: PACU  Anesthesia Type:General  Level of Consciousness: awake, alert  and oriented  Airway & Oxygen Therapy: Patient Spontanous Breathing and Patient connected to nasal cannula oxygen  Post-op Assessment: Report given to RN and Post -op Vital signs reviewed and stable  Post vital signs: Reviewed and stable  Last Vitals:  Vitals:   07/21/16 0707  BP: 108/61  Pulse: (!) 57  Resp: 16  Temp: 36.6 C    Last Pain:  Vitals:   07/21/16 0727  TempSrc:   PainSc: 0-No pain         Complications: No apparent anesthesia complications

## 2016-07-21 NOTE — Anesthesia Preprocedure Evaluation (Signed)
Anesthesia Evaluation  Patient identified by MRN, date of birth, ID band Patient awake    Reviewed: Allergy & Precautions, NPO status , Patient's Chart, lab work & pertinent test results  Airway Mallampati: II       Dental   Pulmonary asthma ,    breath sounds clear to auscultation       Cardiovascular negative cardio ROS   Rhythm:Regular Rate:Normal     Neuro/Psych    GI/Hepatic negative GI ROS, Neg liver ROS,   Endo/Other  negative endocrine ROS  Renal/GU negative Renal ROS     Musculoskeletal  (+) Arthritis ,   Abdominal   Peds  Hematology   Anesthesia Other Findings   Reproductive/Obstetrics                             Anesthesia Physical Anesthesia Plan  ASA: II  Anesthesia Plan: General   Post-op Pain Management:    Induction: Intravenous  Airway Management Planned: LMA  Additional Equipment:   Intra-op Plan:   Post-operative Plan: Extubation in OR  Informed Consent: I have reviewed the patients History and Physical, chart, labs and discussed the procedure including the risks, benefits and alternatives for the proposed anesthesia with the patient or authorized representative who has indicated his/her understanding and acceptance.   Dental advisory given  Plan Discussed with: CRNA and Anesthesiologist  Anesthesia Plan Comments:         Anesthesia Quick Evaluation

## 2016-07-21 NOTE — Anesthesia Postprocedure Evaluation (Signed)
Anesthesia Post Note  Patient: Joan Chan  Procedure(s) Performed: Procedure(s) (LRB): RIGHT ARTHROSCOPY KNEE WITH LATERAL MENISCUS DEBRIDMENT AND CHONDROPLASTY (Right)  Patient location during evaluation: PACU Anesthesia Type: General Level of consciousness: awake Pain management: pain level controlled Vital Signs Assessment: post-procedure vital signs reviewed and stable Respiratory status: spontaneous breathing Cardiovascular status: stable Anesthetic complications: no    Last Vitals:  Vitals:   07/21/16 0945 07/21/16 1000  BP: 125/86 134/64  Pulse: 65 (!) 51  Resp: 13 10  Temp: 36.6 C     Last Pain:  Vitals:   07/21/16 1000  TempSrc:   PainSc: 6                  EDWARDS,Charell Faulk

## 2016-07-21 NOTE — Discharge Instructions (Signed)
° °Dr. Frank Aluisio °Total Joint Specialist °[facility] °[address] °[place], [place] [address] °[phone] ° ° °Arthroscopic Procedure, Knee °An arthroscopic procedure can find what is wrong with your knee. °PROCEDURE °Arthroscopy is a surgical technique that allows your orthopedic surgeon to diagnose and treat your knee injury with accuracy. They will look into your knee through a small instrument. This is almost like a small (pencil sized) telescope. Because arthroscopy affects your knee less than open knee surgery, you can anticipate a more rapid recovery. Taking an active role by following your caregiver's instructions will help with rapid and complete recovery. Use crutches, rest, elevation, ice, and knee exercises as instructed. The length of recovery depends on various factors including type of injury, age, physical condition, medical conditions, and your rehabilitation. °Your knee is the joint between the large bones (femur and tibia) in your leg. Cartilage covers these bone ends which are smooth and slippery and allow your knee to bend and move smoothly. Two menisci, thick, semi-lunar shaped pads of cartilage which form a rim inside the joint, help absorb shock and stabilize your knee. Ligaments bind the bones together and support your knee joint. Muscles move the joint, help support your knee, and take stress off the joint itself. Because of this all programs and physical therapy to rehabilitate an injured or repaired knee require rebuilding and strengthening your muscles. °AFTER THE PROCEDURE °· After the procedure, you will be moved to a recovery area until most of the effects of the medication have worn off. Your caregiver will discuss the test results with you.  °· Only take over-the-counter or prescription medicines for pain, discomfort, or fever as directed by your caregiver.  °SEEK MEDICAL CARE IF:  °· You have increased bleeding from your wounds.  °· You see  redness, swelling, or have increasing pain in your wounds.  °· You have pus coming from your wound.  °· You have an oral temperature above 102° F (38.9° C).  °· You notice a bad smell coming from the wound or dressing.  °· You have severe pain with any motion of your knee.  °SEEK IMMEDIATE MEDICAL CARE IF:  °· You develop a rash.  °· You have difficulty breathing.  °· You have any allergic problems.  °FURTHER INSTRUCTIONS:  °· ICE to the affected knee every three hours for 30 minutes at a time and then as needed for pain and swelling.  Continue to use ice on the knee for pain and swelling from surgery. You may notice swelling that will progress down to the foot and ankle.  This is normal after surgery.  Elevate the leg when you are not up walking on it.   ° °DIET °You may resume your previous home diet once your are discharged from the hospital. ° °DRESSING / WOUND CARE / SHOWERING °You may start showering two days after being discharged home but do not submerge the incisions under water.  °Change dressing 48 hours after the procedure and then cover the small incisions with band aids until your follow up visit. °Change the surgical dressings daily and reapply a dry dressing each time.  ° °ACTIVITY °Walk with your walker as instructed. °Use walker as long as suggested by your caregivers. °Avoid periods of inactivity such as sitting longer than an hour when not asleep. This helps prevent blood clots.  °You may resume a sexual relationship in one month or when given the OK by your doctor.  °You may return to   work once you are cleared by your doctor.  °Do not drive a car for 6 weeks or until released by you surgeon.  °Do not drive while taking narcotics. ° °WEIGHT BEARING AS TOLERATED ° °POSTOPERATIVE CONSTIPATION PROTOCOL °Constipation - defined medically as fewer than three stools per week and severe constipation as less than one stool per week. ° °One of the most common issues patients have following surgery is  constipation.  Even if you have a regular bowel pattern at home, your normal regimen is likely to be disrupted due to multiple reasons following surgery.  Combination of anesthesia, postoperative narcotics, change in appetite and fluid intake all can affect your bowels.  In order to avoid complications following surgery, here are some recommendations in order to help you during your recovery period. ° °Colace (docusate) - Pick up an over-the-counter form of Colace or another stool softener and take twice a day as long as you are requiring postoperative pain medications.  Take with a full glass of water daily.  If you experience loose stools or diarrhea, hold the colace until you stool forms back up.  If your symptoms do not get better within 1 week or if they get worse, check with your doctor. ° °Dulcolax (bisacodyl) - Pick up over-the-counter and take as directed by the product packaging as needed to assist with the movement of your bowels.  Take with a full glass of water.  Use this product as needed if not relieved by Colace only.  ° °MiraLax (polyethylene glycol) - Pick up over-the-counter to have on hand.  MiraLax is a solution that will increase the amount of water in your bowels to assist with bowel movements.  Take as directed and can mix with a glass of water, juice, soda, coffee, or tea.  Take if you go more than two days without a movement. °Do not use MiraLax more than once per day. Call your doctor if you are still constipated or irregular after using this medication for 7 days in a row. ° °If you continue to have problems with postoperative constipation, please contact the office for further assistance and recommendations.  If you experience "the worst abdominal pain ever" or develop nausea or vomiting, please contact the office immediatly for further recommendations for treatment. ° °ITCHING ° If you experience itching with your medications, try taking only a single pain pill, or even half a pain pill  at a time.  You can also use Benadryl over the counter for itching or also to help with sleep.  ° °TED HOSE STOCKINGS °Wear the elastic stockings on both legs for three weeks following surgery during the day but you may remove then at night for sleeping. ° °MEDICATIONS °See your medication summary on the “After Visit Summary” that the nursing staff will review with you prior to discharge.  You may have some home medications which will be placed on hold until you complete the course of blood thinner medication.  It is important for you to complete the blood thinner medication as prescribed by your surgeon.  Continue your approved medications as instructed at time of discharge. °Do not drive while taking narcotics.  ° °PRECAUTIONS °If you experience chest pain or shortness of breath - call 911 immediately for transfer to the hospital emergency department.  °If you develop a fever greater that 101 F, purulent drainage from wound, increased redness or drainage from wound, foul odor from the wound/dressing, or calf pain - CONTACT YOUR SURGEON.   °                                                °  FOLLOW-UP APPOINTMENTS Make sure you keep all of your appointments after your operation with your surgeon and caregivers. You should call the office at [phone]  and make an appointment for approximately one week after the date of your surgery or on the date instructed by your surgeon outlined in the "After Visit Summary".  RANGE OF MOTION AND STRENGTHENING EXERCISES  Rehabilitation of the knee is important following a knee injury or an operation. After just a few days of immobilization, the muscles of the thigh which control the knee become weakened and shrink (atrophy). Knee exercises are designed to build up the tone and strength of the thigh muscles and to improve knee motion. Often times heat used for twenty to thirty minutes before working out will loosen up your tissues and help with improving the range of motion  but do not use heat for the first two weeks following surgery. These exercises can be done on a training (exercise) mat, on the floor, on a table or on a bed. Use what ever works the best and is most comfortable for you Knee exercises include:  QUAD STRENGTHENING EXERCISES Strengthening Quadriceps Sets  Tighten muscles on top of thigh by pushing knees down into floor or table. Hold for 20 seconds. Repeat 10 times. Do 2 sessions per day.     Strengthening Terminal Knee Extension  With knee bent over bolster, straighten knee by tightening muscle on top of thigh. Be sure to keep bottom of knee on bolster. Hold for 20 seconds. Repeat 10 times. Do 2 sessions per day.   Straight Leg with Bent Knee General Anesthesia, Adult, Care After Refer to this sheet in the next few weeks. These instructions provide you with information on caring for yourself after your procedure. Your health care provider may also give you more specific instructions. Your treatment has been planned according to current medical practices, but problems sometimes occur. Call your health care provider if you have any problems or questions after your procedure. WHAT TO EXPECT AFTER THE PROCEDURE After the procedure, it is typical to experience:  Sleepiness.  Nausea and vomiting. HOME CARE INSTRUCTIONS  For the first 24 hours after general anesthesia:  Have a responsible person with you.  Do not drive a car. If you are alone, do not take public transportation.  Do not drink alcohol.  Do not take medicine that has not been prescribed by your health care provider.  Do not sign important papers or make important decisions.  You may resume a normal diet and activities as directed by your health care provider.  Change bandages (dressings) as directed.  If you have questions or problems that seem related to general anesthesia, call the hospital and ask for the anesthetist or anesthesiologist on call. SEEK MEDICAL  CARE IF:  You have nausea and vomiting that continue the day after anesthesia.  You develop a rash. SEEK IMMEDIATE MEDICAL CARE IF:   You have difficulty breathing.  You have chest pain.  You have any allergic problems.   This information is not intended to replace advice given to you by your health care provider. Make sure you discuss any questions you have with your health care provider.   Document Released: 01/17/2001 Document Revised: 11/01/2014 Document Reviewed: 02/09/2012 Elsevier Interactive Patient Education 2016 [place] on back with opposite leg bent. Keep involved knee slightly bent at knee and raise leg 4-6". Hold for 10 seconds. Repeat 20 times per set. Do 2 sets per session. Do  2 sessions per day.

## 2016-07-22 ENCOUNTER — Encounter (HOSPITAL_COMMUNITY): Payer: Self-pay | Admitting: Orthopedic Surgery

## 2016-07-22 DIAGNOSIS — Z85828 Personal history of other malignant neoplasm of skin: Secondary | ICD-10-CM | POA: Diagnosis not present

## 2016-07-22 DIAGNOSIS — D225 Melanocytic nevi of trunk: Secondary | ICD-10-CM | POA: Diagnosis not present

## 2016-07-22 DIAGNOSIS — D1800 Hemangioma unspecified site: Secondary | ICD-10-CM | POA: Diagnosis not present

## 2016-07-22 DIAGNOSIS — L821 Other seborrheic keratosis: Secondary | ICD-10-CM | POA: Diagnosis not present

## 2016-07-22 DIAGNOSIS — Z411 Encounter for cosmetic surgery: Secondary | ICD-10-CM | POA: Diagnosis not present

## 2016-07-22 DIAGNOSIS — L570 Actinic keratosis: Secondary | ICD-10-CM | POA: Diagnosis not present

## 2016-07-22 DIAGNOSIS — Z23 Encounter for immunization: Secondary | ICD-10-CM | POA: Diagnosis not present

## 2016-07-22 DIAGNOSIS — L814 Other melanin hyperpigmentation: Secondary | ICD-10-CM | POA: Diagnosis not present

## 2016-07-27 DIAGNOSIS — Z4789 Encounter for other orthopedic aftercare: Secondary | ICD-10-CM | POA: Diagnosis not present

## 2016-07-27 DIAGNOSIS — M23351 Other meniscus derangements, posterior horn of lateral meniscus, right knee: Secondary | ICD-10-CM | POA: Diagnosis not present

## 2016-08-03 DIAGNOSIS — H6121 Impacted cerumen, right ear: Secondary | ICD-10-CM | POA: Diagnosis not present

## 2016-08-03 DIAGNOSIS — H93293 Other abnormal auditory perceptions, bilateral: Secondary | ICD-10-CM | POA: Diagnosis not present

## 2016-08-17 DIAGNOSIS — Z4789 Encounter for other orthopedic aftercare: Secondary | ICD-10-CM | POA: Diagnosis not present

## 2016-08-17 DIAGNOSIS — M23351 Other meniscus derangements, posterior horn of lateral meniscus, right knee: Secondary | ICD-10-CM | POA: Diagnosis not present

## 2016-08-17 DIAGNOSIS — M23321 Other meniscus derangements, posterior horn of medial meniscus, right knee: Secondary | ICD-10-CM | POA: Diagnosis not present

## 2016-08-17 DIAGNOSIS — M1711 Unilateral primary osteoarthritis, right knee: Secondary | ICD-10-CM | POA: Diagnosis not present

## 2016-09-08 DIAGNOSIS — R944 Abnormal results of kidney function studies: Secondary | ICD-10-CM | POA: Diagnosis not present

## 2016-09-08 DIAGNOSIS — Z23 Encounter for immunization: Secondary | ICD-10-CM | POA: Diagnosis not present

## 2016-11-06 DIAGNOSIS — J069 Acute upper respiratory infection, unspecified: Secondary | ICD-10-CM | POA: Diagnosis not present

## 2016-11-15 DIAGNOSIS — J209 Acute bronchitis, unspecified: Secondary | ICD-10-CM | POA: Diagnosis not present

## 2016-12-04 IMAGING — CR DG PORTABLE PELVIS
1 series · 2 of 2 positions shown · non-contrast
Comparison: None.

CLINICAL DATA: Left hip replacement

EXAM:
PORTABLE PELVIS 1-2 VIEWS

[Series 1: AP · U · 2 of 2 slices shown]
[im 1/2]
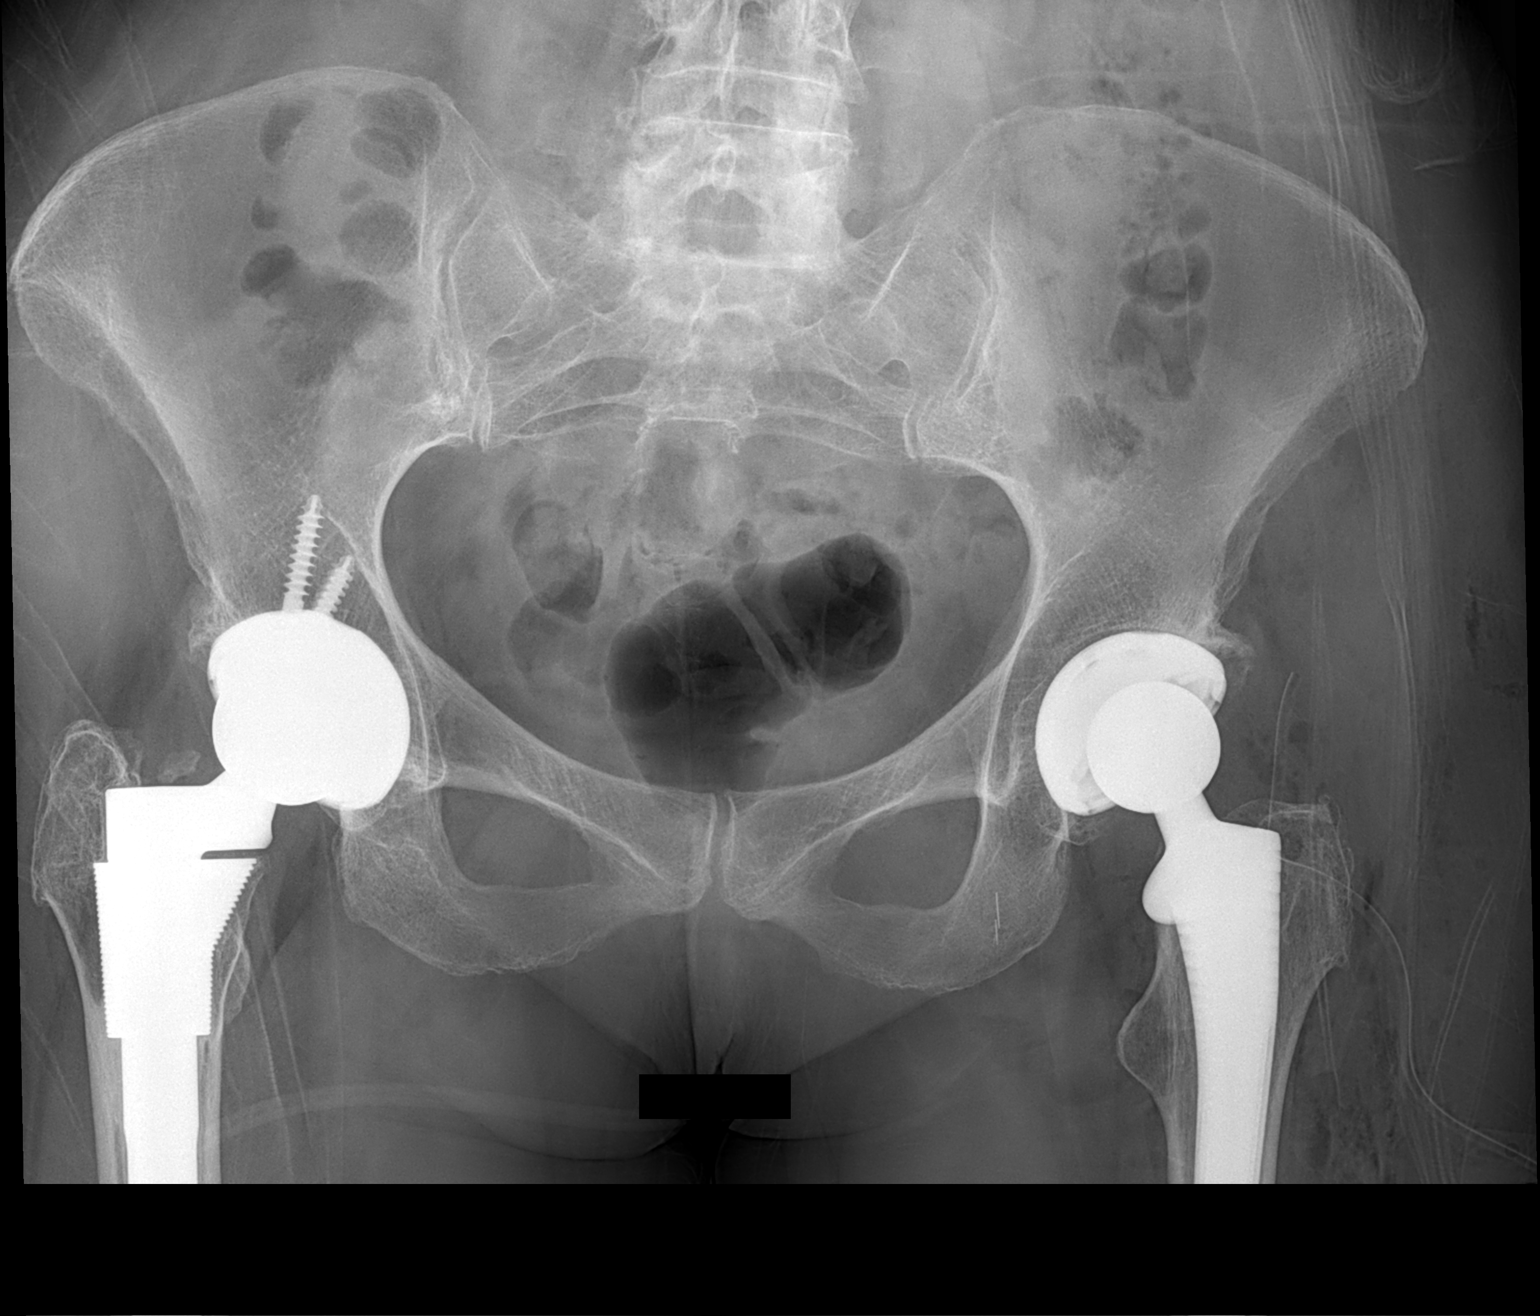
[im 2/2]
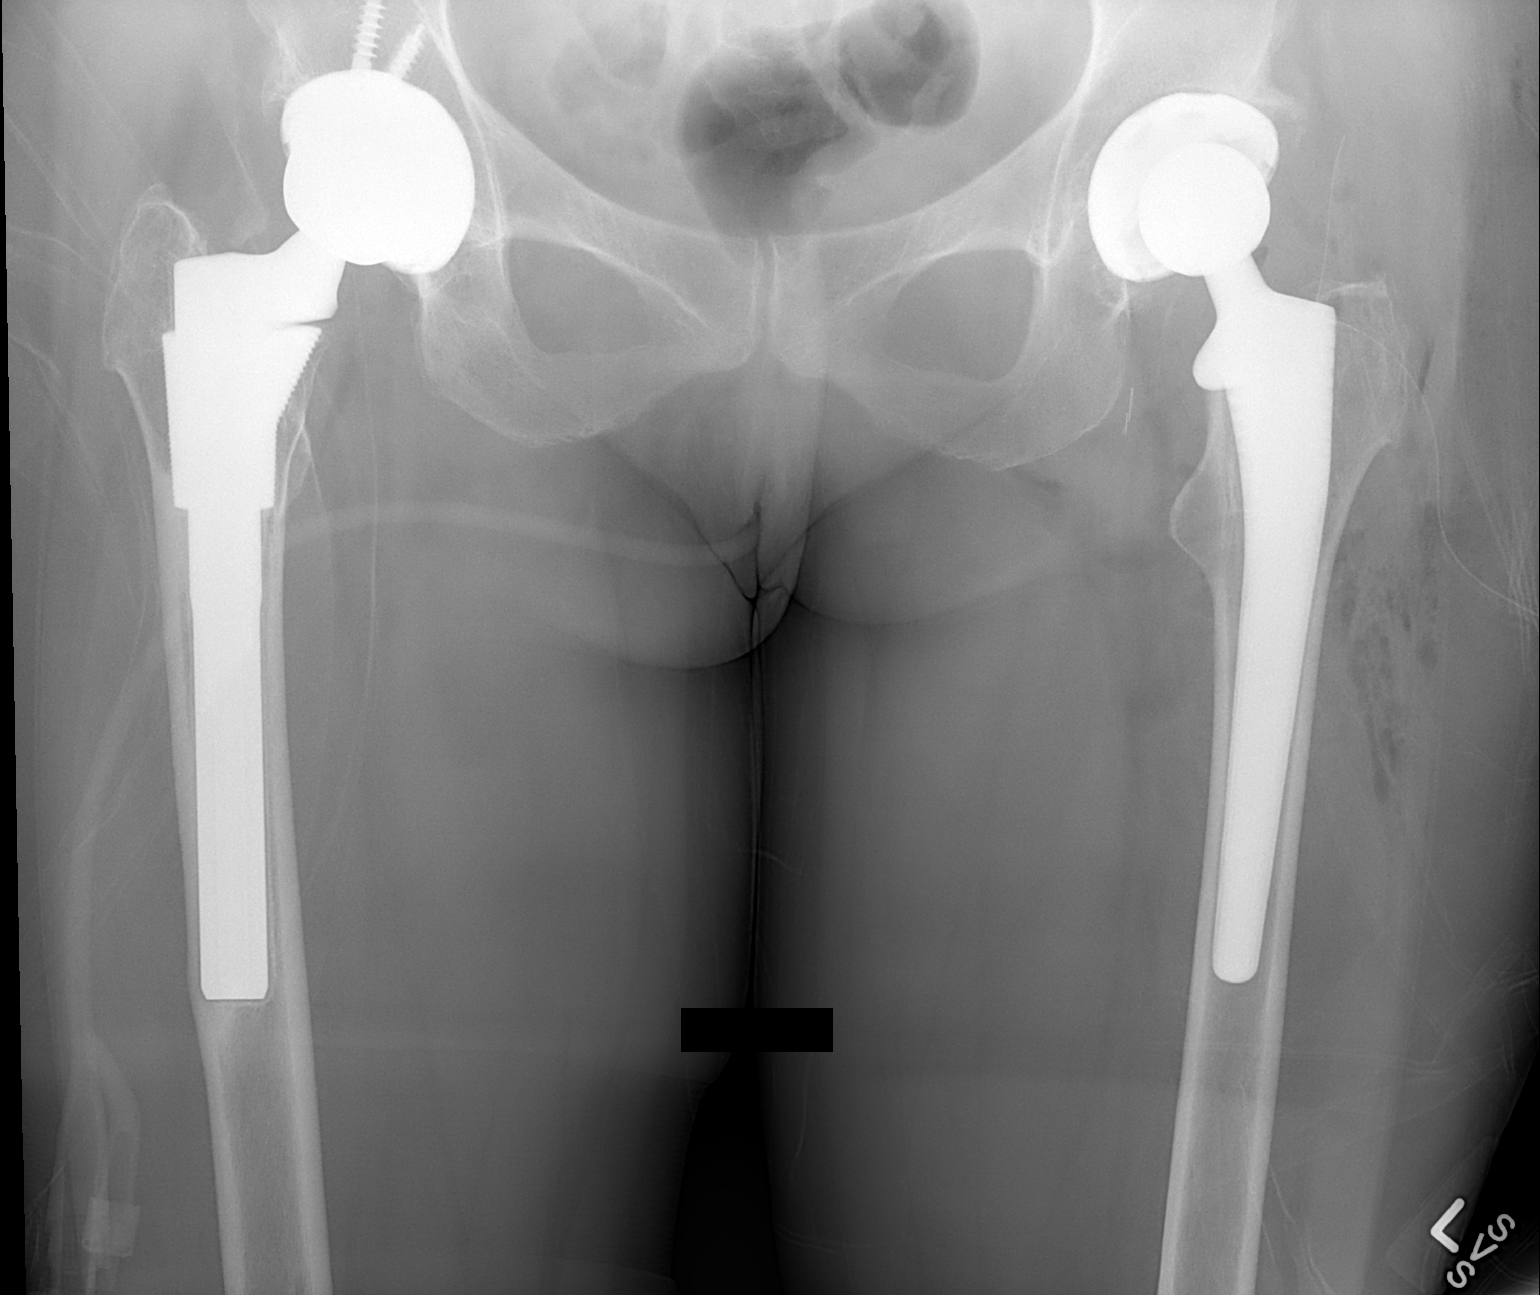

[2 of 2 positions shown; findings below may reference images not displayed]

FINDINGS: Interval left total hip arthroplasty without failure or
complication. No fracture or dislocation. Postsurgical changes in
the soft tissues surrounding the left hip with surgical drains in
place.

Prior right total hip arthroplasty without failure or complication.
IMPRESSION: Interval left total hip arthroplasty.

## 2017-01-19 DIAGNOSIS — H93291 Other abnormal auditory perceptions, right ear: Secondary | ICD-10-CM | POA: Diagnosis not present

## 2017-01-19 DIAGNOSIS — H6123 Impacted cerumen, bilateral: Secondary | ICD-10-CM | POA: Diagnosis not present

## 2017-01-19 DIAGNOSIS — Z7289 Other problems related to lifestyle: Secondary | ICD-10-CM | POA: Diagnosis not present

## 2017-04-15 DIAGNOSIS — H35363 Drusen (degenerative) of macula, bilateral: Secondary | ICD-10-CM | POA: Diagnosis not present

## 2017-04-15 DIAGNOSIS — H40013 Open angle with borderline findings, low risk, bilateral: Secondary | ICD-10-CM | POA: Diagnosis not present

## 2017-04-15 DIAGNOSIS — H25013 Cortical age-related cataract, bilateral: Secondary | ICD-10-CM | POA: Diagnosis not present

## 2017-04-15 DIAGNOSIS — H2513 Age-related nuclear cataract, bilateral: Secondary | ICD-10-CM | POA: Diagnosis not present

## 2017-05-09 DIAGNOSIS — L309 Dermatitis, unspecified: Secondary | ICD-10-CM | POA: Diagnosis not present

## 2017-07-19 DIAGNOSIS — Z1231 Encounter for screening mammogram for malignant neoplasm of breast: Secondary | ICD-10-CM | POA: Diagnosis not present

## 2017-07-28 DIAGNOSIS — D1800 Hemangioma unspecified site: Secondary | ICD-10-CM | POA: Diagnosis not present

## 2017-07-28 DIAGNOSIS — L570 Actinic keratosis: Secondary | ICD-10-CM | POA: Diagnosis not present

## 2017-07-28 DIAGNOSIS — L814 Other melanin hyperpigmentation: Secondary | ICD-10-CM | POA: Diagnosis not present

## 2017-07-28 DIAGNOSIS — L821 Other seborrheic keratosis: Secondary | ICD-10-CM | POA: Diagnosis not present

## 2017-07-28 DIAGNOSIS — Z85828 Personal history of other malignant neoplasm of skin: Secondary | ICD-10-CM | POA: Diagnosis not present

## 2017-07-28 DIAGNOSIS — D225 Melanocytic nevi of trunk: Secondary | ICD-10-CM | POA: Diagnosis not present

## 2017-07-28 DIAGNOSIS — Z23 Encounter for immunization: Secondary | ICD-10-CM | POA: Diagnosis not present

## 2017-08-10 DIAGNOSIS — Z7289 Other problems related to lifestyle: Secondary | ICD-10-CM | POA: Diagnosis not present

## 2017-08-10 DIAGNOSIS — H93291 Other abnormal auditory perceptions, right ear: Secondary | ICD-10-CM | POA: Diagnosis not present

## 2017-08-10 DIAGNOSIS — H6121 Impacted cerumen, right ear: Secondary | ICD-10-CM | POA: Diagnosis not present

## 2017-08-10 DIAGNOSIS — H938X2 Other specified disorders of left ear: Secondary | ICD-10-CM | POA: Diagnosis not present

## 2017-08-18 DIAGNOSIS — L570 Actinic keratosis: Secondary | ICD-10-CM | POA: Diagnosis not present

## 2018-01-04 DIAGNOSIS — H6123 Impacted cerumen, bilateral: Secondary | ICD-10-CM | POA: Diagnosis not present

## 2018-04-25 DIAGNOSIS — H35363 Drusen (degenerative) of macula, bilateral: Secondary | ICD-10-CM | POA: Diagnosis not present

## 2018-04-25 DIAGNOSIS — H25013 Cortical age-related cataract, bilateral: Secondary | ICD-10-CM | POA: Diagnosis not present

## 2018-04-25 DIAGNOSIS — H40013 Open angle with borderline findings, low risk, bilateral: Secondary | ICD-10-CM | POA: Diagnosis not present

## 2018-04-25 DIAGNOSIS — H2513 Age-related nuclear cataract, bilateral: Secondary | ICD-10-CM | POA: Diagnosis not present

## 2018-05-19 DIAGNOSIS — M25532 Pain in left wrist: Secondary | ICD-10-CM | POA: Diagnosis not present

## 2018-05-19 DIAGNOSIS — S63502A Unspecified sprain of left wrist, initial encounter: Secondary | ICD-10-CM | POA: Diagnosis not present

## 2018-06-22 DIAGNOSIS — H6123 Impacted cerumen, bilateral: Secondary | ICD-10-CM | POA: Diagnosis not present

## 2018-07-31 DIAGNOSIS — L821 Other seborrheic keratosis: Secondary | ICD-10-CM | POA: Diagnosis not present

## 2018-07-31 DIAGNOSIS — L820 Inflamed seborrheic keratosis: Secondary | ICD-10-CM | POA: Diagnosis not present

## 2018-07-31 DIAGNOSIS — D225 Melanocytic nevi of trunk: Secondary | ICD-10-CM | POA: Diagnosis not present

## 2018-07-31 DIAGNOSIS — Z23 Encounter for immunization: Secondary | ICD-10-CM | POA: Diagnosis not present

## 2018-07-31 DIAGNOSIS — L570 Actinic keratosis: Secondary | ICD-10-CM | POA: Diagnosis not present

## 2018-07-31 DIAGNOSIS — Z1231 Encounter for screening mammogram for malignant neoplasm of breast: Secondary | ICD-10-CM | POA: Diagnosis not present

## 2018-07-31 DIAGNOSIS — Z85828 Personal history of other malignant neoplasm of skin: Secondary | ICD-10-CM | POA: Diagnosis not present

## 2018-07-31 DIAGNOSIS — L814 Other melanin hyperpigmentation: Secondary | ICD-10-CM | POA: Diagnosis not present

## 2018-08-07 DIAGNOSIS — Z8371 Family history of colonic polyps: Secondary | ICD-10-CM | POA: Diagnosis not present

## 2018-08-07 DIAGNOSIS — D122 Benign neoplasm of ascending colon: Secondary | ICD-10-CM | POA: Diagnosis not present

## 2018-08-07 DIAGNOSIS — K648 Other hemorrhoids: Secondary | ICD-10-CM | POA: Diagnosis not present

## 2018-08-07 DIAGNOSIS — Z1211 Encounter for screening for malignant neoplasm of colon: Secondary | ICD-10-CM | POA: Diagnosis not present

## 2018-08-07 DIAGNOSIS — K5730 Diverticulosis of large intestine without perforation or abscess without bleeding: Secondary | ICD-10-CM | POA: Diagnosis not present

## 2018-08-07 DIAGNOSIS — K635 Polyp of colon: Secondary | ICD-10-CM | POA: Diagnosis not present

## 2018-08-09 DIAGNOSIS — D122 Benign neoplasm of ascending colon: Secondary | ICD-10-CM | POA: Diagnosis not present

## 2018-08-09 DIAGNOSIS — Z1211 Encounter for screening for malignant neoplasm of colon: Secondary | ICD-10-CM | POA: Diagnosis not present

## 2018-08-09 DIAGNOSIS — K635 Polyp of colon: Secondary | ICD-10-CM | POA: Diagnosis not present

## 2018-10-05 DIAGNOSIS — M9903 Segmental and somatic dysfunction of lumbar region: Secondary | ICD-10-CM | POA: Diagnosis not present

## 2018-10-05 DIAGNOSIS — M545 Low back pain: Secondary | ICD-10-CM | POA: Diagnosis not present

## 2018-10-09 DIAGNOSIS — M545 Low back pain: Secondary | ICD-10-CM | POA: Diagnosis not present

## 2018-10-09 DIAGNOSIS — M9903 Segmental and somatic dysfunction of lumbar region: Secondary | ICD-10-CM | POA: Diagnosis not present

## 2018-10-16 DIAGNOSIS — M545 Low back pain: Secondary | ICD-10-CM | POA: Diagnosis not present

## 2018-10-16 DIAGNOSIS — M9903 Segmental and somatic dysfunction of lumbar region: Secondary | ICD-10-CM | POA: Diagnosis not present

## 2018-10-23 DIAGNOSIS — M545 Low back pain: Secondary | ICD-10-CM | POA: Diagnosis not present

## 2018-10-23 DIAGNOSIS — M9903 Segmental and somatic dysfunction of lumbar region: Secondary | ICD-10-CM | POA: Diagnosis not present

## 2018-10-30 DIAGNOSIS — M545 Low back pain: Secondary | ICD-10-CM | POA: Diagnosis not present

## 2018-10-30 DIAGNOSIS — M9903 Segmental and somatic dysfunction of lumbar region: Secondary | ICD-10-CM | POA: Diagnosis not present

## 2018-12-14 DIAGNOSIS — H938X2 Other specified disorders of left ear: Secondary | ICD-10-CM | POA: Diagnosis not present

## 2018-12-14 DIAGNOSIS — H6121 Impacted cerumen, right ear: Secondary | ICD-10-CM | POA: Diagnosis not present

## 2018-12-18 DIAGNOSIS — E559 Vitamin D deficiency, unspecified: Secondary | ICD-10-CM | POA: Diagnosis not present

## 2018-12-18 DIAGNOSIS — Z0000 Encounter for general adult medical examination without abnormal findings: Secondary | ICD-10-CM | POA: Diagnosis not present

## 2018-12-18 DIAGNOSIS — M810 Age-related osteoporosis without current pathological fracture: Secondary | ICD-10-CM | POA: Diagnosis not present

## 2018-12-18 DIAGNOSIS — Z1389 Encounter for screening for other disorder: Secondary | ICD-10-CM | POA: Diagnosis not present

## 2018-12-18 DIAGNOSIS — Z23 Encounter for immunization: Secondary | ICD-10-CM | POA: Diagnosis not present

## 2018-12-18 DIAGNOSIS — E7800 Pure hypercholesterolemia, unspecified: Secondary | ICD-10-CM | POA: Diagnosis not present

## 2019-01-25 DIAGNOSIS — M25552 Pain in left hip: Secondary | ICD-10-CM | POA: Diagnosis not present

## 2019-01-25 DIAGNOSIS — M25551 Pain in right hip: Secondary | ICD-10-CM | POA: Diagnosis not present

## 2019-04-30 DIAGNOSIS — H2513 Age-related nuclear cataract, bilateral: Secondary | ICD-10-CM | POA: Diagnosis not present

## 2019-04-30 DIAGNOSIS — H25013 Cortical age-related cataract, bilateral: Secondary | ICD-10-CM | POA: Diagnosis not present

## 2019-04-30 DIAGNOSIS — H40013 Open angle with borderline findings, low risk, bilateral: Secondary | ICD-10-CM | POA: Diagnosis not present

## 2019-04-30 DIAGNOSIS — H35363 Drusen (degenerative) of macula, bilateral: Secondary | ICD-10-CM | POA: Diagnosis not present

## 2019-07-11 DIAGNOSIS — H6123 Impacted cerumen, bilateral: Secondary | ICD-10-CM | POA: Diagnosis not present

## 2019-07-23 DIAGNOSIS — L820 Inflamed seborrheic keratosis: Secondary | ICD-10-CM | POA: Diagnosis not present

## 2019-07-23 DIAGNOSIS — Z411 Encounter for cosmetic surgery: Secondary | ICD-10-CM | POA: Diagnosis not present

## 2019-07-23 DIAGNOSIS — Z23 Encounter for immunization: Secondary | ICD-10-CM | POA: Diagnosis not present

## 2019-07-23 DIAGNOSIS — L570 Actinic keratosis: Secondary | ICD-10-CM | POA: Diagnosis not present

## 2019-08-06 DIAGNOSIS — Z23 Encounter for immunization: Secondary | ICD-10-CM | POA: Diagnosis not present

## 2019-08-06 DIAGNOSIS — Z1231 Encounter for screening mammogram for malignant neoplasm of breast: Secondary | ICD-10-CM | POA: Diagnosis not present

## 2019-08-20 DIAGNOSIS — L814 Other melanin hyperpigmentation: Secondary | ICD-10-CM | POA: Diagnosis not present

## 2019-08-20 DIAGNOSIS — L570 Actinic keratosis: Secondary | ICD-10-CM | POA: Diagnosis not present

## 2019-08-20 DIAGNOSIS — D225 Melanocytic nevi of trunk: Secondary | ICD-10-CM | POA: Diagnosis not present

## 2019-08-20 DIAGNOSIS — D485 Neoplasm of uncertain behavior of skin: Secondary | ICD-10-CM | POA: Diagnosis not present

## 2019-08-20 DIAGNOSIS — L820 Inflamed seborrheic keratosis: Secondary | ICD-10-CM | POA: Diagnosis not present

## 2019-08-20 DIAGNOSIS — Z23 Encounter for immunization: Secondary | ICD-10-CM | POA: Diagnosis not present

## 2019-08-20 DIAGNOSIS — Z85828 Personal history of other malignant neoplasm of skin: Secondary | ICD-10-CM | POA: Diagnosis not present

## 2019-08-20 DIAGNOSIS — C44629 Squamous cell carcinoma of skin of left upper limb, including shoulder: Secondary | ICD-10-CM | POA: Diagnosis not present

## 2019-08-20 DIAGNOSIS — L821 Other seborrheic keratosis: Secondary | ICD-10-CM | POA: Diagnosis not present

## 2019-09-05 DIAGNOSIS — L905 Scar conditions and fibrosis of skin: Secondary | ICD-10-CM | POA: Diagnosis not present

## 2019-09-05 DIAGNOSIS — C44629 Squamous cell carcinoma of skin of left upper limb, including shoulder: Secondary | ICD-10-CM | POA: Diagnosis not present

## 2019-09-21 DIAGNOSIS — Z4802 Encounter for removal of sutures: Secondary | ICD-10-CM | POA: Diagnosis not present

## 2019-11-21 ENCOUNTER — Ambulatory Visit: Payer: Medicare Other

## 2019-11-30 ENCOUNTER — Ambulatory Visit: Payer: Medicare Other

## 2019-12-04 ENCOUNTER — Ambulatory Visit: Payer: Medicare Other | Attending: Internal Medicine

## 2019-12-04 DIAGNOSIS — Z23 Encounter for immunization: Secondary | ICD-10-CM | POA: Insufficient documentation

## 2019-12-04 NOTE — Progress Notes (Signed)
   Covid-19 Vaccination Clinic  Name:  Joan Chan    MRN: [id number] DOB: 1951/10/10  12/04/2019  [name] was observed post Covid-19 immunization for 15 minutes without incidence. She was provided with Vaccine Information Sheet and instruction to access the V-Safe system.   [name] was instructed to call 911 with any severe reactions post vaccine: Marland Kitchen Difficulty breathing  . Swelling of your face and throat  . A fast heartbeat  . A bad rash all over your body  . Dizziness and weakness    Immunizations Administered    Name Date Dose VIS Date Route   Pfizer COVID-19 Vaccine 12/04/2019  1:58 PM 0.3 mL 10/05/2019 Intramuscular   Manufacturer: [place]   Lot: [id number]   [place]: [id number]

## 2019-12-05 DIAGNOSIS — H6121 Impacted cerumen, right ear: Secondary | ICD-10-CM | POA: Diagnosis not present

## 2019-12-06 ENCOUNTER — Ambulatory Visit: Payer: Medicare Other

## 2019-12-12 ENCOUNTER — Ambulatory Visit: Payer: Medicare Other

## 2019-12-29 ENCOUNTER — Ambulatory Visit: Payer: Medicare Other | Attending: Internal Medicine

## 2019-12-29 DIAGNOSIS — Z23 Encounter for immunization: Secondary | ICD-10-CM

## 2019-12-29 NOTE — Progress Notes (Signed)
   Covid-19 Vaccination Clinic  Name:  JESSI HAUGHNEY    MRN: [id number] DOB: [birth year]  12/29/2019  Ms. Felling was observed post Covid-19 immunization for 15 minutes without incident. She was provided with Vaccine Information Sheet and instruction to access the V-Safe system.   Ms. Dirusso was instructed to call 911 with any severe reactions post vaccine: Marland Kitchen Difficulty breathing  . Swelling of face and throat  . A fast heartbeat  . A bad rash all over body  . Dizziness and weakness   Immunizations Administered    Name Date Dose VIS Date Route   Pfizer COVID-19 Vaccine 12/29/2019  8:46 AM 0.3 mL 10/05/2019 Intramuscular   Manufacturer: [place]   Lot: [id number]   [place]: [id number]

## 2020-04-02 DIAGNOSIS — M25562 Pain in left knee: Secondary | ICD-10-CM | POA: Diagnosis not present

## 2020-06-16 DIAGNOSIS — Z1389 Encounter for screening for other disorder: Secondary | ICD-10-CM | POA: Diagnosis not present

## 2020-06-16 DIAGNOSIS — M810 Age-related osteoporosis without current pathological fracture: Secondary | ICD-10-CM | POA: Diagnosis not present

## 2020-06-16 DIAGNOSIS — Z23 Encounter for immunization: Secondary | ICD-10-CM | POA: Diagnosis not present

## 2020-06-16 DIAGNOSIS — E7800 Pure hypercholesterolemia, unspecified: Secondary | ICD-10-CM | POA: Diagnosis not present

## 2020-06-16 DIAGNOSIS — Z0000 Encounter for general adult medical examination without abnormal findings: Secondary | ICD-10-CM | POA: Diagnosis not present

## 2020-06-16 DIAGNOSIS — E559 Vitamin D deficiency, unspecified: Secondary | ICD-10-CM | POA: Diagnosis not present

## 2020-06-20 DIAGNOSIS — H25013 Cortical age-related cataract, bilateral: Secondary | ICD-10-CM | POA: Diagnosis not present

## 2020-06-20 DIAGNOSIS — H35363 Drusen (degenerative) of macula, bilateral: Secondary | ICD-10-CM | POA: Diagnosis not present

## 2020-06-20 DIAGNOSIS — H40013 Open angle with borderline findings, low risk, bilateral: Secondary | ICD-10-CM | POA: Diagnosis not present

## 2020-06-20 DIAGNOSIS — H2513 Age-related nuclear cataract, bilateral: Secondary | ICD-10-CM | POA: Diagnosis not present

## 2020-06-23 DIAGNOSIS — H938X3 Other specified disorders of ear, bilateral: Secondary | ICD-10-CM | POA: Diagnosis not present

## 2020-07-21 DIAGNOSIS — Z23 Encounter for immunization: Secondary | ICD-10-CM | POA: Diagnosis not present

## 2020-07-23 DIAGNOSIS — C44529 Squamous cell carcinoma of skin of other part of trunk: Secondary | ICD-10-CM | POA: Diagnosis not present

## 2020-07-23 DIAGNOSIS — D485 Neoplasm of uncertain behavior of skin: Secondary | ICD-10-CM | POA: Diagnosis not present

## 2020-07-23 DIAGNOSIS — D0462 Carcinoma in situ of skin of left upper limb, including shoulder: Secondary | ICD-10-CM | POA: Diagnosis not present

## 2020-07-23 DIAGNOSIS — L820 Inflamed seborrheic keratosis: Secondary | ICD-10-CM | POA: Diagnosis not present

## 2020-07-23 DIAGNOSIS — L570 Actinic keratosis: Secondary | ICD-10-CM | POA: Diagnosis not present

## 2020-08-07 DIAGNOSIS — D0462 Carcinoma in situ of skin of left upper limb, including shoulder: Secondary | ICD-10-CM | POA: Diagnosis not present

## 2020-08-07 DIAGNOSIS — C44529 Squamous cell carcinoma of skin of other part of trunk: Secondary | ICD-10-CM | POA: Diagnosis not present

## 2020-08-07 DIAGNOSIS — L905 Scar conditions and fibrosis of skin: Secondary | ICD-10-CM | POA: Diagnosis not present

## 2020-08-18 DIAGNOSIS — D225 Melanocytic nevi of trunk: Secondary | ICD-10-CM | POA: Diagnosis not present

## 2020-08-18 DIAGNOSIS — Z411 Encounter for cosmetic surgery: Secondary | ICD-10-CM | POA: Diagnosis not present

## 2020-08-18 DIAGNOSIS — L821 Other seborrheic keratosis: Secondary | ICD-10-CM | POA: Diagnosis not present

## 2020-08-18 DIAGNOSIS — Z85828 Personal history of other malignant neoplasm of skin: Secondary | ICD-10-CM | POA: Diagnosis not present

## 2020-08-18 DIAGNOSIS — L570 Actinic keratosis: Secondary | ICD-10-CM | POA: Diagnosis not present

## 2020-08-18 DIAGNOSIS — L814 Other melanin hyperpigmentation: Secondary | ICD-10-CM | POA: Diagnosis not present

## 2020-12-08 DIAGNOSIS — H6123 Impacted cerumen, bilateral: Secondary | ICD-10-CM | POA: Diagnosis not present

## 2021-02-10 DIAGNOSIS — M1712 Unilateral primary osteoarthritis, left knee: Secondary | ICD-10-CM | POA: Diagnosis not present

## 2021-02-10 DIAGNOSIS — M170 Bilateral primary osteoarthritis of knee: Secondary | ICD-10-CM | POA: Diagnosis not present

## 2021-04-03 DIAGNOSIS — J019 Acute sinusitis, unspecified: Secondary | ICD-10-CM | POA: Diagnosis not present

## 2021-06-01 DIAGNOSIS — H6121 Impacted cerumen, right ear: Secondary | ICD-10-CM | POA: Diagnosis not present

## 2021-06-22 DIAGNOSIS — H2513 Age-related nuclear cataract, bilateral: Secondary | ICD-10-CM | POA: Diagnosis not present

## 2021-06-22 DIAGNOSIS — H40013 Open angle with borderline findings, low risk, bilateral: Secondary | ICD-10-CM | POA: Diagnosis not present

## 2021-06-22 DIAGNOSIS — H35363 Drusen (degenerative) of macula, bilateral: Secondary | ICD-10-CM | POA: Diagnosis not present

## 2021-06-22 DIAGNOSIS — H25013 Cortical age-related cataract, bilateral: Secondary | ICD-10-CM | POA: Diagnosis not present

## 2021-06-30 DIAGNOSIS — Z1159 Encounter for screening for other viral diseases: Secondary | ICD-10-CM | POA: Diagnosis not present

## 2021-06-30 DIAGNOSIS — Z1389 Encounter for screening for other disorder: Secondary | ICD-10-CM | POA: Diagnosis not present

## 2021-06-30 DIAGNOSIS — Z0000 Encounter for general adult medical examination without abnormal findings: Secondary | ICD-10-CM | POA: Diagnosis not present

## 2021-06-30 DIAGNOSIS — E7800 Pure hypercholesterolemia, unspecified: Secondary | ICD-10-CM | POA: Diagnosis not present

## 2021-06-30 DIAGNOSIS — Z7184 Encounter for health counseling related to travel: Secondary | ICD-10-CM | POA: Diagnosis not present

## 2021-06-30 DIAGNOSIS — M810 Age-related osteoporosis without current pathological fracture: Secondary | ICD-10-CM | POA: Diagnosis not present

## 2021-08-12 DIAGNOSIS — Z23 Encounter for immunization: Secondary | ICD-10-CM | POA: Diagnosis not present

## 2021-08-20 DIAGNOSIS — L821 Other seborrheic keratosis: Secondary | ICD-10-CM | POA: Diagnosis not present

## 2021-08-20 DIAGNOSIS — D485 Neoplasm of uncertain behavior of skin: Secondary | ICD-10-CM | POA: Diagnosis not present

## 2021-08-20 DIAGNOSIS — C44622 Squamous cell carcinoma of skin of right upper limb, including shoulder: Secondary | ICD-10-CM | POA: Diagnosis not present

## 2021-08-20 DIAGNOSIS — Z23 Encounter for immunization: Secondary | ICD-10-CM | POA: Diagnosis not present

## 2021-08-20 DIAGNOSIS — L578 Other skin changes due to chronic exposure to nonionizing radiation: Secondary | ICD-10-CM | POA: Diagnosis not present

## 2021-08-20 DIAGNOSIS — L570 Actinic keratosis: Secondary | ICD-10-CM | POA: Diagnosis not present

## 2021-08-20 DIAGNOSIS — Z85828 Personal history of other malignant neoplasm of skin: Secondary | ICD-10-CM | POA: Diagnosis not present

## 2021-08-20 DIAGNOSIS — D0462 Carcinoma in situ of skin of left upper limb, including shoulder: Secondary | ICD-10-CM | POA: Diagnosis not present

## 2021-08-20 DIAGNOSIS — D225 Melanocytic nevi of trunk: Secondary | ICD-10-CM | POA: Diagnosis not present

## 2021-08-20 DIAGNOSIS — L814 Other melanin hyperpigmentation: Secondary | ICD-10-CM | POA: Diagnosis not present

## 2021-08-24 DIAGNOSIS — Z1231 Encounter for screening mammogram for malignant neoplasm of breast: Secondary | ICD-10-CM | POA: Diagnosis not present

## 2021-08-27 DIAGNOSIS — M170 Bilateral primary osteoarthritis of knee: Secondary | ICD-10-CM | POA: Diagnosis not present

## 2021-09-30 DIAGNOSIS — M1712 Unilateral primary osteoarthritis, left knee: Secondary | ICD-10-CM | POA: Diagnosis not present

## 2021-10-01 DIAGNOSIS — C44622 Squamous cell carcinoma of skin of right upper limb, including shoulder: Secondary | ICD-10-CM | POA: Diagnosis not present

## 2021-10-01 DIAGNOSIS — L905 Scar conditions and fibrosis of skin: Secondary | ICD-10-CM | POA: Diagnosis not present

## 2021-10-07 DIAGNOSIS — M1712 Unilateral primary osteoarthritis, left knee: Secondary | ICD-10-CM | POA: Diagnosis not present

## 2021-10-14 DIAGNOSIS — M1712 Unilateral primary osteoarthritis, left knee: Secondary | ICD-10-CM | POA: Diagnosis not present

## 2021-10-15 DIAGNOSIS — D045 Carcinoma in situ of skin of trunk: Secondary | ICD-10-CM | POA: Diagnosis not present

## 2021-10-15 DIAGNOSIS — Z4802 Encounter for removal of sutures: Secondary | ICD-10-CM | POA: Diagnosis not present

## 2021-10-15 DIAGNOSIS — D485 Neoplasm of uncertain behavior of skin: Secondary | ICD-10-CM | POA: Diagnosis not present

## 2021-11-10 DIAGNOSIS — H6123 Impacted cerumen, bilateral: Secondary | ICD-10-CM | POA: Diagnosis not present

## 2021-11-20 DIAGNOSIS — R062 Wheezing: Secondary | ICD-10-CM | POA: Diagnosis not present

## 2021-11-20 DIAGNOSIS — R051 Acute cough: Secondary | ICD-10-CM | POA: Diagnosis not present

## 2021-11-20 DIAGNOSIS — J069 Acute upper respiratory infection, unspecified: Secondary | ICD-10-CM | POA: Diagnosis not present

## 2021-11-20 DIAGNOSIS — J029 Acute pharyngitis, unspecified: Secondary | ICD-10-CM | POA: Diagnosis not present

## 2021-11-26 DIAGNOSIS — M1712 Unilateral primary osteoarthritis, left knee: Secondary | ICD-10-CM | POA: Diagnosis not present

## 2022-02-15 DIAGNOSIS — L821 Other seborrheic keratosis: Secondary | ICD-10-CM | POA: Diagnosis not present

## 2022-02-15 DIAGNOSIS — Z23 Encounter for immunization: Secondary | ICD-10-CM | POA: Diagnosis not present

## 2022-02-15 DIAGNOSIS — L820 Inflamed seborrheic keratosis: Secondary | ICD-10-CM | POA: Diagnosis not present

## 2022-02-15 DIAGNOSIS — D0462 Carcinoma in situ of skin of left upper limb, including shoulder: Secondary | ICD-10-CM | POA: Diagnosis not present

## 2022-02-15 DIAGNOSIS — C44529 Squamous cell carcinoma of skin of other part of trunk: Secondary | ICD-10-CM | POA: Diagnosis not present

## 2022-02-15 DIAGNOSIS — D225 Melanocytic nevi of trunk: Secondary | ICD-10-CM | POA: Diagnosis not present

## 2022-02-15 DIAGNOSIS — L814 Other melanin hyperpigmentation: Secondary | ICD-10-CM | POA: Diagnosis not present

## 2022-02-15 DIAGNOSIS — L570 Actinic keratosis: Secondary | ICD-10-CM | POA: Diagnosis not present

## 2022-02-15 DIAGNOSIS — Z85828 Personal history of other malignant neoplasm of skin: Secondary | ICD-10-CM | POA: Diagnosis not present

## 2022-03-18 DIAGNOSIS — N951 Menopausal and female climacteric states: Secondary | ICD-10-CM | POA: Diagnosis not present

## 2022-03-18 DIAGNOSIS — R635 Abnormal weight gain: Secondary | ICD-10-CM | POA: Diagnosis not present

## 2022-03-18 DIAGNOSIS — Z6825 Body mass index (BMI) 25.0-25.9, adult: Secondary | ICD-10-CM | POA: Diagnosis not present

## 2022-04-08 DIAGNOSIS — N951 Menopausal and female climacteric states: Secondary | ICD-10-CM | POA: Diagnosis not present

## 2022-04-08 DIAGNOSIS — Z1331 Encounter for screening for depression: Secondary | ICD-10-CM | POA: Diagnosis not present

## 2022-04-08 DIAGNOSIS — Z1339 Encounter for screening examination for other mental health and behavioral disorders: Secondary | ICD-10-CM | POA: Diagnosis not present

## 2022-04-08 DIAGNOSIS — N898 Other specified noninflammatory disorders of vagina: Secondary | ICD-10-CM | POA: Diagnosis not present

## 2022-04-08 DIAGNOSIS — R232 Flushing: Secondary | ICD-10-CM | POA: Diagnosis not present

## 2022-04-08 DIAGNOSIS — Z6824 Body mass index (BMI) 24.0-24.9, adult: Secondary | ICD-10-CM | POA: Diagnosis not present

## 2022-04-08 DIAGNOSIS — R635 Abnormal weight gain: Secondary | ICD-10-CM | POA: Diagnosis not present

## 2022-04-12 DIAGNOSIS — H6123 Impacted cerumen, bilateral: Secondary | ICD-10-CM | POA: Diagnosis not present

## 2022-04-28 DIAGNOSIS — Z6824 Body mass index (BMI) 24.0-24.9, adult: Secondary | ICD-10-CM | POA: Diagnosis not present

## 2022-04-28 DIAGNOSIS — E7800 Pure hypercholesterolemia, unspecified: Secondary | ICD-10-CM | POA: Diagnosis not present

## 2022-05-03 DIAGNOSIS — E7800 Pure hypercholesterolemia, unspecified: Secondary | ICD-10-CM | POA: Diagnosis not present

## 2022-05-03 DIAGNOSIS — Z6825 Body mass index (BMI) 25.0-25.9, adult: Secondary | ICD-10-CM | POA: Diagnosis not present

## 2022-05-11 DIAGNOSIS — Z6825 Body mass index (BMI) 25.0-25.9, adult: Secondary | ICD-10-CM | POA: Diagnosis not present

## 2022-05-11 DIAGNOSIS — E7800 Pure hypercholesterolemia, unspecified: Secondary | ICD-10-CM | POA: Diagnosis not present

## 2022-06-01 DIAGNOSIS — Z6825 Body mass index (BMI) 25.0-25.9, adult: Secondary | ICD-10-CM | POA: Diagnosis not present

## 2022-06-01 DIAGNOSIS — E7800 Pure hypercholesterolemia, unspecified: Secondary | ICD-10-CM | POA: Diagnosis not present

## 2022-06-15 DIAGNOSIS — E7800 Pure hypercholesterolemia, unspecified: Secondary | ICD-10-CM | POA: Diagnosis not present

## 2022-06-15 DIAGNOSIS — Z6824 Body mass index (BMI) 24.0-24.9, adult: Secondary | ICD-10-CM | POA: Diagnosis not present

## 2022-06-21 DIAGNOSIS — H40013 Open angle with borderline findings, low risk, bilateral: Secondary | ICD-10-CM | POA: Diagnosis not present

## 2022-06-21 DIAGNOSIS — H43813 Vitreous degeneration, bilateral: Secondary | ICD-10-CM | POA: Diagnosis not present

## 2022-06-21 DIAGNOSIS — H18513 Endothelial corneal dystrophy, bilateral: Secondary | ICD-10-CM | POA: Diagnosis not present

## 2022-06-21 DIAGNOSIS — H25813 Combined forms of age-related cataract, bilateral: Secondary | ICD-10-CM | POA: Diagnosis not present

## 2022-06-23 DIAGNOSIS — Z411 Encounter for cosmetic surgery: Secondary | ICD-10-CM | POA: Diagnosis not present

## 2022-06-23 DIAGNOSIS — L570 Actinic keratosis: Secondary | ICD-10-CM | POA: Diagnosis not present

## 2022-07-05 DIAGNOSIS — Z6824 Body mass index (BMI) 24.0-24.9, adult: Secondary | ICD-10-CM | POA: Diagnosis not present

## 2022-07-05 DIAGNOSIS — E7800 Pure hypercholesterolemia, unspecified: Secondary | ICD-10-CM | POA: Diagnosis not present

## 2022-07-26 DIAGNOSIS — Z23 Encounter for immunization: Secondary | ICD-10-CM | POA: Diagnosis not present

## 2022-07-26 DIAGNOSIS — E7800 Pure hypercholesterolemia, unspecified: Secondary | ICD-10-CM | POA: Diagnosis not present

## 2022-07-26 DIAGNOSIS — Z1331 Encounter for screening for depression: Secondary | ICD-10-CM | POA: Diagnosis not present

## 2022-07-26 DIAGNOSIS — Z0000 Encounter for general adult medical examination without abnormal findings: Secondary | ICD-10-CM | POA: Diagnosis not present

## 2022-07-26 DIAGNOSIS — Z6824 Body mass index (BMI) 24.0-24.9, adult: Secondary | ICD-10-CM | POA: Diagnosis not present

## 2022-08-16 DIAGNOSIS — Z6824 Body mass index (BMI) 24.0-24.9, adult: Secondary | ICD-10-CM | POA: Diagnosis not present

## 2022-08-16 DIAGNOSIS — E7800 Pure hypercholesterolemia, unspecified: Secondary | ICD-10-CM | POA: Diagnosis not present

## 2022-08-24 DIAGNOSIS — H6123 Impacted cerumen, bilateral: Secondary | ICD-10-CM | POA: Diagnosis not present

## 2022-08-30 DIAGNOSIS — Z1231 Encounter for screening mammogram for malignant neoplasm of breast: Secondary | ICD-10-CM | POA: Diagnosis not present

## 2022-09-08 DIAGNOSIS — L814 Other melanin hyperpigmentation: Secondary | ICD-10-CM | POA: Diagnosis not present

## 2022-09-08 DIAGNOSIS — Z411 Encounter for cosmetic surgery: Secondary | ICD-10-CM | POA: Diagnosis not present

## 2022-09-08 DIAGNOSIS — Z85828 Personal history of other malignant neoplasm of skin: Secondary | ICD-10-CM | POA: Diagnosis not present

## 2022-09-08 DIAGNOSIS — D225 Melanocytic nevi of trunk: Secondary | ICD-10-CM | POA: Diagnosis not present

## 2022-09-08 DIAGNOSIS — L570 Actinic keratosis: Secondary | ICD-10-CM | POA: Diagnosis not present

## 2022-09-08 DIAGNOSIS — L821 Other seborrheic keratosis: Secondary | ICD-10-CM | POA: Diagnosis not present

## 2022-09-08 DIAGNOSIS — L578 Other skin changes due to chronic exposure to nonionizing radiation: Secondary | ICD-10-CM | POA: Diagnosis not present

## 2022-09-27 DIAGNOSIS — Z6823 Body mass index (BMI) 23.0-23.9, adult: Secondary | ICD-10-CM | POA: Diagnosis not present

## 2022-09-27 DIAGNOSIS — E7800 Pure hypercholesterolemia, unspecified: Secondary | ICD-10-CM | POA: Diagnosis not present

## 2022-10-01 DIAGNOSIS — M1711 Unilateral primary osteoarthritis, right knee: Secondary | ICD-10-CM | POA: Diagnosis not present

## 2022-10-01 DIAGNOSIS — M170 Bilateral primary osteoarthritis of knee: Secondary | ICD-10-CM | POA: Diagnosis not present

## 2022-10-01 DIAGNOSIS — M1712 Unilateral primary osteoarthritis, left knee: Secondary | ICD-10-CM | POA: Diagnosis not present

## 2022-10-04 DIAGNOSIS — M170 Bilateral primary osteoarthritis of knee: Secondary | ICD-10-CM | POA: Diagnosis not present

## 2022-10-11 DIAGNOSIS — M1711 Unilateral primary osteoarthritis, right knee: Secondary | ICD-10-CM | POA: Diagnosis not present

## 2022-10-11 DIAGNOSIS — Z23 Encounter for immunization: Secondary | ICD-10-CM | POA: Diagnosis not present

## 2022-10-11 DIAGNOSIS — M1712 Unilateral primary osteoarthritis, left knee: Secondary | ICD-10-CM | POA: Diagnosis not present

## 2022-10-11 DIAGNOSIS — M170 Bilateral primary osteoarthritis of knee: Secondary | ICD-10-CM | POA: Diagnosis not present

## 2022-10-19 DIAGNOSIS — M170 Bilateral primary osteoarthritis of knee: Secondary | ICD-10-CM | POA: Diagnosis not present

## 2022-11-15 DIAGNOSIS — L570 Actinic keratosis: Secondary | ICD-10-CM | POA: Diagnosis not present

## 2022-12-27 DIAGNOSIS — H6123 Impacted cerumen, bilateral: Secondary | ICD-10-CM | POA: Diagnosis not present

## 2023-01-25 DIAGNOSIS — Z411 Encounter for cosmetic surgery: Secondary | ICD-10-CM | POA: Diagnosis not present

## 2023-01-25 DIAGNOSIS — L570 Actinic keratosis: Secondary | ICD-10-CM | POA: Diagnosis not present

## 2023-01-25 DIAGNOSIS — L820 Inflamed seborrheic keratosis: Secondary | ICD-10-CM | POA: Diagnosis not present

## 2023-03-07 DIAGNOSIS — L820 Inflamed seborrheic keratosis: Secondary | ICD-10-CM | POA: Diagnosis not present

## 2023-03-07 DIAGNOSIS — L821 Other seborrheic keratosis: Secondary | ICD-10-CM | POA: Diagnosis not present

## 2023-03-07 DIAGNOSIS — Z85828 Personal history of other malignant neoplasm of skin: Secondary | ICD-10-CM | POA: Diagnosis not present

## 2023-03-07 DIAGNOSIS — L570 Actinic keratosis: Secondary | ICD-10-CM | POA: Diagnosis not present

## 2023-03-07 DIAGNOSIS — D225 Melanocytic nevi of trunk: Secondary | ICD-10-CM | POA: Diagnosis not present

## 2023-03-07 DIAGNOSIS — L578 Other skin changes due to chronic exposure to nonionizing radiation: Secondary | ICD-10-CM | POA: Diagnosis not present

## 2023-03-07 DIAGNOSIS — L814 Other melanin hyperpigmentation: Secondary | ICD-10-CM | POA: Diagnosis not present

## 2023-04-25 DIAGNOSIS — M25572 Pain in left ankle and joints of left foot: Secondary | ICD-10-CM | POA: Diagnosis not present

## 2023-05-09 DIAGNOSIS — M25572 Pain in left ankle and joints of left foot: Secondary | ICD-10-CM | POA: Diagnosis not present

## 2023-05-31 DIAGNOSIS — M25572 Pain in left ankle and joints of left foot: Secondary | ICD-10-CM | POA: Diagnosis not present

## 2023-06-20 DIAGNOSIS — H6123 Impacted cerumen, bilateral: Secondary | ICD-10-CM | POA: Diagnosis not present

## 2023-08-25 DIAGNOSIS — Z8249 Family history of ischemic heart disease and other diseases of the circulatory system: Secondary | ICD-10-CM | POA: Diagnosis not present

## 2023-08-25 DIAGNOSIS — Z1331 Encounter for screening for depression: Secondary | ICD-10-CM | POA: Diagnosis not present

## 2023-08-25 DIAGNOSIS — M85839 Other specified disorders of bone density and structure, unspecified forearm: Secondary | ICD-10-CM | POA: Diagnosis not present

## 2023-08-25 DIAGNOSIS — Z23 Encounter for immunization: Secondary | ICD-10-CM | POA: Diagnosis not present

## 2023-08-25 DIAGNOSIS — Z0000 Encounter for general adult medical examination without abnormal findings: Secondary | ICD-10-CM | POA: Diagnosis not present

## 2023-08-25 DIAGNOSIS — E7800 Pure hypercholesterolemia, unspecified: Secondary | ICD-10-CM | POA: Diagnosis not present

## 2023-08-26 ENCOUNTER — Other Ambulatory Visit (HOSPITAL_BASED_OUTPATIENT_CLINIC_OR_DEPARTMENT_OTHER): Payer: Self-pay | Admitting: Family Medicine

## 2023-08-26 DIAGNOSIS — Z8249 Family history of ischemic heart disease and other diseases of the circulatory system: Secondary | ICD-10-CM

## 2023-09-05 DIAGNOSIS — Z1231 Encounter for screening mammogram for malignant neoplasm of breast: Secondary | ICD-10-CM | POA: Diagnosis not present

## 2023-09-05 DIAGNOSIS — J22 Unspecified acute lower respiratory infection: Secondary | ICD-10-CM | POA: Diagnosis not present

## 2023-09-05 DIAGNOSIS — M8588 Other specified disorders of bone density and structure, other site: Secondary | ICD-10-CM | POA: Diagnosis not present

## 2023-09-05 DIAGNOSIS — H66001 Acute suppurative otitis media without spontaneous rupture of ear drum, right ear: Secondary | ICD-10-CM | POA: Diagnosis not present

## 2023-09-06 DIAGNOSIS — L821 Other seborrheic keratosis: Secondary | ICD-10-CM | POA: Diagnosis not present

## 2023-09-06 DIAGNOSIS — Z85828 Personal history of other malignant neoplasm of skin: Secondary | ICD-10-CM | POA: Diagnosis not present

## 2023-09-06 DIAGNOSIS — Z411 Encounter for cosmetic surgery: Secondary | ICD-10-CM | POA: Diagnosis not present

## 2023-09-06 DIAGNOSIS — L814 Other melanin hyperpigmentation: Secondary | ICD-10-CM | POA: Diagnosis not present

## 2023-09-06 DIAGNOSIS — L820 Inflamed seborrheic keratosis: Secondary | ICD-10-CM | POA: Diagnosis not present

## 2023-09-06 DIAGNOSIS — L570 Actinic keratosis: Secondary | ICD-10-CM | POA: Diagnosis not present

## 2023-09-06 DIAGNOSIS — D225 Melanocytic nevi of trunk: Secondary | ICD-10-CM | POA: Diagnosis not present

## 2023-09-06 DIAGNOSIS — L578 Other skin changes due to chronic exposure to nonionizing radiation: Secondary | ICD-10-CM | POA: Diagnosis not present

## 2023-09-15 DIAGNOSIS — H6993 Unspecified Eustachian tube disorder, bilateral: Secondary | ICD-10-CM | POA: Diagnosis not present

## 2023-09-15 DIAGNOSIS — H6503 Acute serous otitis media, bilateral: Secondary | ICD-10-CM | POA: Diagnosis not present

## 2023-09-15 DIAGNOSIS — H6123 Impacted cerumen, bilateral: Secondary | ICD-10-CM | POA: Diagnosis not present

## 2023-09-19 DIAGNOSIS — H18513 Endothelial corneal dystrophy, bilateral: Secondary | ICD-10-CM | POA: Diagnosis not present

## 2023-09-19 DIAGNOSIS — H25813 Combined forms of age-related cataract, bilateral: Secondary | ICD-10-CM | POA: Diagnosis not present

## 2023-09-19 DIAGNOSIS — H04123 Dry eye syndrome of bilateral lacrimal glands: Secondary | ICD-10-CM | POA: Diagnosis not present

## 2023-09-19 DIAGNOSIS — H40013 Open angle with borderline findings, low risk, bilateral: Secondary | ICD-10-CM | POA: Diagnosis not present

## 2023-10-05 ENCOUNTER — Ambulatory Visit (HOSPITAL_BASED_OUTPATIENT_CLINIC_OR_DEPARTMENT_OTHER)
Admission: RE | Admit: 2023-10-05 | Discharge: 2023-10-05 | Disposition: A | Discharge status: Home / Self Care | Payer: Medicare Other | Source: Ambulatory Visit | Attending: Family Medicine | Admitting: Family Medicine

## 2023-10-05 DIAGNOSIS — Z8249 Family history of ischemic heart disease and other diseases of the circulatory system: Secondary | ICD-10-CM | POA: Insufficient documentation

## 2023-10-06 DIAGNOSIS — K621 Rectal polyp: Secondary | ICD-10-CM | POA: Diagnosis not present

## 2023-10-06 DIAGNOSIS — Z09 Encounter for follow-up examination after completed treatment for conditions other than malignant neoplasm: Secondary | ICD-10-CM | POA: Diagnosis not present

## 2023-10-06 DIAGNOSIS — K635 Polyp of colon: Secondary | ICD-10-CM | POA: Diagnosis not present

## 2023-10-06 DIAGNOSIS — K5730 Diverticulosis of large intestine without perforation or abscess without bleeding: Secondary | ICD-10-CM | POA: Diagnosis not present

## 2023-10-06 DIAGNOSIS — Z860101 Personal history of adenomatous and serrated colon polyps: Secondary | ICD-10-CM | POA: Diagnosis not present

## 2023-10-10 DIAGNOSIS — K635 Polyp of colon: Secondary | ICD-10-CM | POA: Diagnosis not present

## 2023-10-10 DIAGNOSIS — K621 Rectal polyp: Secondary | ICD-10-CM | POA: Diagnosis not present

## 2023-10-17 DIAGNOSIS — H6521 Chronic serous otitis media, right ear: Secondary | ICD-10-CM | POA: Diagnosis not present

## 2023-10-17 DIAGNOSIS — H6123 Impacted cerumen, bilateral: Secondary | ICD-10-CM | POA: Diagnosis not present

## 2023-10-17 DIAGNOSIS — H900 Conductive hearing loss, bilateral: Secondary | ICD-10-CM | POA: Diagnosis not present

## 2023-10-24 DIAGNOSIS — H903 Sensorineural hearing loss, bilateral: Secondary | ICD-10-CM | POA: Diagnosis not present

## 2023-11-15 DIAGNOSIS — S9031XA Contusion of right foot, initial encounter: Secondary | ICD-10-CM | POA: Diagnosis not present

## 2024-02-28 DIAGNOSIS — D485 Neoplasm of uncertain behavior of skin: Secondary | ICD-10-CM | POA: Diagnosis not present

## 2024-02-28 DIAGNOSIS — C4442 Squamous cell carcinoma of skin of scalp and neck: Secondary | ICD-10-CM | POA: Diagnosis not present

## 2024-03-01 DIAGNOSIS — C4442 Squamous cell carcinoma of skin of scalp and neck: Secondary | ICD-10-CM | POA: Diagnosis not present

## 2024-03-07 DIAGNOSIS — D225 Melanocytic nevi of trunk: Secondary | ICD-10-CM | POA: Diagnosis not present

## 2024-03-07 DIAGNOSIS — L570 Actinic keratosis: Secondary | ICD-10-CM | POA: Diagnosis not present

## 2024-03-07 DIAGNOSIS — L821 Other seborrheic keratosis: Secondary | ICD-10-CM | POA: Diagnosis not present

## 2024-03-07 DIAGNOSIS — L578 Other skin changes due to chronic exposure to nonionizing radiation: Secondary | ICD-10-CM | POA: Diagnosis not present

## 2024-03-07 DIAGNOSIS — L089 Local infection of the skin and subcutaneous tissue, unspecified: Secondary | ICD-10-CM | POA: Diagnosis not present

## 2024-03-07 DIAGNOSIS — Z411 Encounter for cosmetic surgery: Secondary | ICD-10-CM | POA: Diagnosis not present

## 2024-05-02 DIAGNOSIS — Z6825 Body mass index (BMI) 25.0-25.9, adult: Secondary | ICD-10-CM | POA: Diagnosis not present

## 2024-05-02 DIAGNOSIS — I700 Atherosclerosis of aorta: Secondary | ICD-10-CM | POA: Diagnosis not present

## 2024-05-02 DIAGNOSIS — E663 Overweight: Secondary | ICD-10-CM | POA: Diagnosis not present

## 2024-05-02 DIAGNOSIS — Z1331 Encounter for screening for depression: Secondary | ICD-10-CM | POA: Diagnosis not present

## 2024-05-02 DIAGNOSIS — E8889 Other specified metabolic disorders: Secondary | ICD-10-CM | POA: Diagnosis not present

## 2024-05-02 DIAGNOSIS — E7800 Pure hypercholesterolemia, unspecified: Secondary | ICD-10-CM | POA: Diagnosis not present

## 2024-05-29 DIAGNOSIS — Z6825 Body mass index (BMI) 25.0-25.9, adult: Secondary | ICD-10-CM | POA: Diagnosis not present

## 2024-05-29 DIAGNOSIS — R7303 Prediabetes: Secondary | ICD-10-CM | POA: Diagnosis not present

## 2024-05-29 DIAGNOSIS — I700 Atherosclerosis of aorta: Secondary | ICD-10-CM | POA: Diagnosis not present

## 2024-05-29 DIAGNOSIS — E7800 Pure hypercholesterolemia, unspecified: Secondary | ICD-10-CM | POA: Diagnosis not present

## 2024-05-29 DIAGNOSIS — E663 Overweight: Secondary | ICD-10-CM | POA: Diagnosis not present

## 2024-06-04 DIAGNOSIS — H6123 Impacted cerumen, bilateral: Secondary | ICD-10-CM | POA: Diagnosis not present

## 2024-06-13 DIAGNOSIS — L820 Inflamed seborrheic keratosis: Secondary | ICD-10-CM | POA: Diagnosis not present

## 2024-06-13 DIAGNOSIS — Z411 Encounter for cosmetic surgery: Secondary | ICD-10-CM | POA: Diagnosis not present

## 2024-06-13 DIAGNOSIS — L309 Dermatitis, unspecified: Secondary | ICD-10-CM | POA: Diagnosis not present

## 2024-07-09 DIAGNOSIS — Z6824 Body mass index (BMI) 24.0-24.9, adult: Secondary | ICD-10-CM | POA: Diagnosis not present

## 2024-07-09 DIAGNOSIS — R7303 Prediabetes: Secondary | ICD-10-CM | POA: Diagnosis not present

## 2024-07-09 DIAGNOSIS — E7800 Pure hypercholesterolemia, unspecified: Secondary | ICD-10-CM | POA: Diagnosis not present

## 2024-07-09 DIAGNOSIS — I700 Atherosclerosis of aorta: Secondary | ICD-10-CM | POA: Diagnosis not present

## 2024-07-09 DIAGNOSIS — E663 Overweight: Secondary | ICD-10-CM | POA: Diagnosis not present

## 2024-08-13 DIAGNOSIS — Z6824 Body mass index (BMI) 24.0-24.9, adult: Secondary | ICD-10-CM | POA: Diagnosis not present

## 2024-08-13 DIAGNOSIS — E663 Overweight: Secondary | ICD-10-CM | POA: Diagnosis not present

## 2024-08-13 DIAGNOSIS — E7800 Pure hypercholesterolemia, unspecified: Secondary | ICD-10-CM | POA: Diagnosis not present

## 2024-08-13 DIAGNOSIS — R7303 Prediabetes: Secondary | ICD-10-CM | POA: Diagnosis not present

## 2024-08-13 DIAGNOSIS — I700 Atherosclerosis of aorta: Secondary | ICD-10-CM | POA: Diagnosis not present

## 2024-08-27 DIAGNOSIS — Z23 Encounter for immunization: Secondary | ICD-10-CM | POA: Diagnosis not present

## 2024-08-28 DIAGNOSIS — Z1331 Encounter for screening for depression: Secondary | ICD-10-CM | POA: Diagnosis not present

## 2024-08-28 DIAGNOSIS — Z7184 Encounter for health counseling related to travel: Secondary | ICD-10-CM | POA: Diagnosis not present

## 2024-08-28 DIAGNOSIS — E7800 Pure hypercholesterolemia, unspecified: Secondary | ICD-10-CM | POA: Diagnosis not present

## 2024-08-28 DIAGNOSIS — R7303 Prediabetes: Secondary | ICD-10-CM | POA: Diagnosis not present

## 2024-08-28 DIAGNOSIS — Z0000 Encounter for general adult medical examination without abnormal findings: Secondary | ICD-10-CM | POA: Diagnosis not present

## 2024-08-28 DIAGNOSIS — I700 Atherosclerosis of aorta: Secondary | ICD-10-CM | POA: Diagnosis not present

## 2024-09-10 DIAGNOSIS — L578 Other skin changes due to chronic exposure to nonionizing radiation: Secondary | ICD-10-CM | POA: Diagnosis not present

## 2024-09-10 DIAGNOSIS — L570 Actinic keratosis: Secondary | ICD-10-CM | POA: Diagnosis not present

## 2024-09-10 DIAGNOSIS — L814 Other melanin hyperpigmentation: Secondary | ICD-10-CM | POA: Diagnosis not present

## 2024-09-10 DIAGNOSIS — Z411 Encounter for cosmetic surgery: Secondary | ICD-10-CM | POA: Diagnosis not present

## 2024-09-10 DIAGNOSIS — Z85828 Personal history of other malignant neoplasm of skin: Secondary | ICD-10-CM | POA: Diagnosis not present

## 2024-09-10 DIAGNOSIS — L821 Other seborrheic keratosis: Secondary | ICD-10-CM | POA: Diagnosis not present

## 2024-09-10 DIAGNOSIS — D225 Melanocytic nevi of trunk: Secondary | ICD-10-CM | POA: Diagnosis not present

## 2024-09-11 DIAGNOSIS — H6123 Impacted cerumen, bilateral: Secondary | ICD-10-CM | POA: Diagnosis not present

## 2024-09-14 DIAGNOSIS — Z1231 Encounter for screening mammogram for malignant neoplasm of breast: Secondary | ICD-10-CM | POA: Diagnosis not present

## 2024-10-04 DIAGNOSIS — E663 Overweight: Secondary | ICD-10-CM | POA: Diagnosis not present

## 2024-10-04 DIAGNOSIS — R7303 Prediabetes: Secondary | ICD-10-CM | POA: Diagnosis not present

## 2024-10-04 DIAGNOSIS — E7800 Pure hypercholesterolemia, unspecified: Secondary | ICD-10-CM | POA: Diagnosis not present

## 2024-10-04 DIAGNOSIS — I700 Atherosclerosis of aorta: Secondary | ICD-10-CM | POA: Diagnosis not present

## 2024-10-04 DIAGNOSIS — Z6824 Body mass index (BMI) 24.0-24.9, adult: Secondary | ICD-10-CM | POA: Diagnosis not present

## 2024-10-06 DIAGNOSIS — H40013 Open angle with borderline findings, low risk, bilateral: Secondary | ICD-10-CM | POA: Diagnosis not present

## 2024-10-06 DIAGNOSIS — B8809 Other acariasis: Secondary | ICD-10-CM | POA: Diagnosis not present

## 2024-10-06 DIAGNOSIS — H01004 Unspecified blepharitis left upper eyelid: Secondary | ICD-10-CM | POA: Diagnosis not present

## 2024-10-06 DIAGNOSIS — H01001 Unspecified blepharitis right upper eyelid: Secondary | ICD-10-CM | POA: Diagnosis not present
